# Patient Record
Sex: Male | Born: 1960 | Race: Black or African American | Hispanic: No | Marital: Single | Smoking: Current every day smoker
Health system: Southern US, Community
[De-identification: ages and names within clinical notes are randomized; demographics above are authoritative.]

## PROBLEM LIST (undated history)

## (undated) DIAGNOSIS — F419 Anxiety disorder, unspecified: Secondary | ICD-10-CM

## (undated) DIAGNOSIS — F1910 Other psychoactive substance abuse, uncomplicated: Secondary | ICD-10-CM

## (undated) DIAGNOSIS — I10 Essential (primary) hypertension: Secondary | ICD-10-CM

## (undated) DIAGNOSIS — M109 Gout, unspecified: Secondary | ICD-10-CM

## (undated) DIAGNOSIS — K219 Gastro-esophageal reflux disease without esophagitis: Secondary | ICD-10-CM

## (undated) DIAGNOSIS — E119 Type 2 diabetes mellitus without complications: Secondary | ICD-10-CM

## (undated) HISTORY — DX: Other psychoactive substance abuse, uncomplicated: F19.10

## (undated) HISTORY — DX: Gastro-esophageal reflux disease without esophagitis: K21.9

## (undated) HISTORY — DX: Gout, unspecified: M10.9

## (undated) HISTORY — DX: Type 2 diabetes mellitus without complications: E11.9

## (undated) HISTORY — DX: Anxiety disorder, unspecified: F41.9

---

## 2001-05-30 ENCOUNTER — Emergency Department (HOSPITAL_COMMUNITY): Admission: EM | Admit: 2001-05-30 | Discharge: 2001-05-30 | Payer: Self-pay | Admitting: Emergency Medicine

## 2004-03-19 ENCOUNTER — Emergency Department (HOSPITAL_COMMUNITY): Admission: EM | Admit: 2004-03-19 | Discharge: 2004-03-19 | Payer: Self-pay | Admitting: Emergency Medicine

## 2007-05-11 ENCOUNTER — Emergency Department (HOSPITAL_COMMUNITY): Admission: EM | Admit: 2007-05-11 | Discharge: 2007-05-11 | Payer: Self-pay | Admitting: Emergency Medicine

## 2007-05-19 ENCOUNTER — Emergency Department (HOSPITAL_COMMUNITY): Admission: EM | Admit: 2007-05-19 | Discharge: 2007-05-19 | Payer: Self-pay | Admitting: Emergency Medicine

## 2008-01-17 ENCOUNTER — Emergency Department (HOSPITAL_COMMUNITY): Admission: EM | Admit: 2008-01-17 | Discharge: 2008-01-17 | Payer: Self-pay | Admitting: Emergency Medicine

## 2010-10-23 NOTE — Consult Note (Signed)
NAME:  David Nicholson, HAUSEN NO.:  [id number]   MEDICAL RECORD NO.:  [id number]          PATIENT TYPE:  EMS   LOCATION:  ED                            FACILITY:  [facility]   PHYSICIAN:  J. Darreld Mclean, M.D. DATE OF BIRTH:  12/18/1960   DATE OF CONSULTATION:  03/19/2004  DATE OF DISCHARGE:  03/19/2004                                   CONSULTATION   REASON FOR CONSULTATION:  The patient got hurt on the job today.  He had a  cut to his right long finger.  He had more of a crush type injury with a  significant stellate laceration of the pulp area with some loss of the pulp  from the cut.  He was seen by Dr. Margretta Ditty in the ER, and Dr.  _______________.  Dr. Margretta Ditty has already, per my instructions, cleansed  the wound, applied scarlet red, and a bulky finger dressing.  The patient  did not lose the nail, but the nail was crushed and he may eventually need  to lose it.  There were no other injuries.  The patient is in generally good  health.   ALLERGIES:  He denies any allergies.   HOSPITAL COURSE:  He has received an antibiotic in the ER and pain  medication.   He is in a bulky dressing.   X-rays show no fracture.   IMPRESSION:  Deep laceration of the pulp area, stellate type, macerated to  the right long finger pulp area, loss of some skin and pulp.   PLAN:  Continue the scarlet red large pressure dressing.  I will see him in  the office tomorrow and remove the dressing and apply new scarlet red.  It  will take some time for this to heal in.  Prescription for Vicodin he is  given for pain, and for Keflex.  If any difficulties, asked to return to the  ER tonight.  Use a sling, keep the wound dry.  Precautions were given.      JWK/MEDQ  D:  03/19/2004  T:  03/19/2004  Job:  [id number]

## 2011-03-05 LAB — DIFFERENTIAL
Basophils Absolute: 0
Basophils Relative: 0
Eosinophils Absolute: 0.1
Eosinophils Relative: 1
Lymphocytes Relative: 28
Lymphs Abs: 2.2
Monocytes Absolute: 0.6
Monocytes Relative: 8
Neutro Abs: 5
Neutrophils Relative %: 63

## 2011-03-05 LAB — COMPREHENSIVE METABOLIC PANEL
ALT: 18
AST: 16
Albumin: 3.5
Alkaline Phosphatase: 44
BUN: 9
CO2: 27
Calcium: 9.3
Chloride: 105
Creatinine, Ser: 0.99
GFR calc Af Amer: >60
GFR calc non Af Amer: >60
Glucose, Bld: 131 — ABNORMAL HIGH
Potassium: 3.8
Sodium: 138
Total Bilirubin: 0.5
Total Protein: 6.1

## 2011-03-05 LAB — TYPE AND SCREEN
ABO/RH(D): O POS
Antibody Screen: NEGATIVE

## 2011-03-05 LAB — PROTIME-INR
INR: 1
Prothrombin Time: 13.1

## 2011-03-05 LAB — CBC
HCT: 36.6 — ABNORMAL LOW
Hemoglobin: 12.1 — ABNORMAL LOW
MCHC: 33
MCV: 86.8
Platelets: 270
RBC: 4.22
RDW: 14.8
WBC: 7.9

## 2011-03-05 LAB — LIPASE, BLOOD: Lipase: 93 — ABNORMAL HIGH

## 2011-05-24 ENCOUNTER — Emergency Department (HOSPITAL_COMMUNITY): Payer: BC Managed Care – PPO

## 2011-05-24 ENCOUNTER — Emergency Department (HOSPITAL_COMMUNITY)
Admission: EM | Admit: 2011-05-24 | Discharge: 2011-05-24 | Disposition: A | Discharge status: Home / Self Care | Payer: BC Managed Care – PPO | Attending: Emergency Medicine | Admitting: Emergency Medicine

## 2011-05-24 DIAGNOSIS — R51 Headache: Secondary | ICD-10-CM | POA: Insufficient documentation

## 2011-05-24 DIAGNOSIS — R05 Cough: Secondary | ICD-10-CM

## 2011-05-24 DIAGNOSIS — F17200 Nicotine dependence, unspecified, uncomplicated: Secondary | ICD-10-CM | POA: Insufficient documentation

## 2011-05-24 DIAGNOSIS — J069 Acute upper respiratory infection, unspecified: Secondary | ICD-10-CM | POA: Insufficient documentation

## 2011-05-24 DIAGNOSIS — IMO0001 Reserved for inherently not codable concepts without codable children: Secondary | ICD-10-CM | POA: Insufficient documentation

## 2011-05-24 DIAGNOSIS — R110 Nausea: Secondary | ICD-10-CM | POA: Insufficient documentation

## 2011-05-24 DIAGNOSIS — J3489 Other specified disorders of nose and nasal sinuses: Secondary | ICD-10-CM | POA: Insufficient documentation

## 2011-05-24 DIAGNOSIS — I10 Essential (primary) hypertension: Secondary | ICD-10-CM | POA: Insufficient documentation

## 2011-05-24 DIAGNOSIS — R509 Fever, unspecified: Secondary | ICD-10-CM | POA: Insufficient documentation

## 2011-05-24 DIAGNOSIS — R059 Cough, unspecified: Secondary | ICD-10-CM | POA: Insufficient documentation

## 2011-05-24 HISTORY — DX: Essential (primary) hypertension: I10

## 2011-05-24 LAB — BASIC METABOLIC PANEL
BUN: 11 mg/dL (ref 6–23)
CO2: 27 mEq/L (ref 19–32)
Calcium: 9.9 mg/dL (ref 8.4–10.5)
Chloride: 100 mEq/L (ref 96–112)
Creatinine, Ser: 1.11 mg/dL (ref 0.50–1.35)
GFR calc Af Amer: 87 mL/min — ABNORMAL LOW (ref >90)
GFR calc non Af Amer: 75 mL/min — ABNORMAL LOW (ref >90)
Glucose, Bld: 125 mg/dL — ABNORMAL HIGH (ref 70–99)
Potassium: 3.6 mEq/L (ref 3.5–5.1)
Sodium: 138 mEq/L (ref 135–145)

## 2011-05-24 LAB — DIFFERENTIAL
Basophils Absolute: 0 10*3/uL (ref 0.0–0.1)
Basophils Relative: 0 % (ref 0–1)
Eosinophils Absolute: 0 10*3/uL (ref 0.0–0.7)
Eosinophils Relative: 0 % (ref 0–5)
Lymphocytes Relative: 6 % — ABNORMAL LOW (ref 12–46)
Lymphs Abs: 0.7 10*3/uL (ref 0.7–4.0)
Monocytes Absolute: 0.7 10*3/uL (ref 0.1–1.0)
Monocytes Relative: 6 % (ref 3–12)
Neutro Abs: 9.5 10*3/uL — ABNORMAL HIGH (ref 1.7–7.7)
Neutrophils Relative %: 88 % — ABNORMAL HIGH (ref 43–77)

## 2011-05-24 LAB — CBC
HCT: 39.6 % (ref 39.0–52.0)
Hemoglobin: 13 g/dL (ref 13.0–17.0)
MCH: 28 pg (ref 26.0–34.0)
MCHC: 32.8 g/dL (ref 30.0–36.0)
MCV: 85.2 fL (ref 78.0–100.0)
Platelets: 234 10*3/uL (ref 150–400)
RBC: 4.65 MIL/uL (ref 4.22–5.81)
RDW: 14.7 % (ref 11.5–15.5)
WBC: 10.9 10*3/uL — ABNORMAL HIGH (ref 4.0–10.5)

## 2011-05-24 MED ORDER — IBUPROFEN 800 MG PO TABS: 800.0000 mg | ORAL_TABLET | Freq: Three times a day (TID) | ORAL | Status: AC

## 2011-05-24 MED ORDER — ONDANSETRON HCL 4 MG PO TABS: 4.0000 mg | ORAL_TABLET | Freq: Four times a day (QID) | ORAL | Status: AC

## 2011-05-24 MED ORDER — ONDANSETRON 8 MG PO TBDP
8.0000 mg | ORAL_TABLET | Freq: Once | ORAL | Status: AC
Start: 2011-05-24 — End: 2011-05-24
  Administered 2011-05-24: 8 mg via ORAL
  Filled 2011-05-24: qty 1

## 2011-05-24 MED ORDER — HYDROCOD POLST-CHLORPHEN POLST 10-8 MG/5ML PO LQCR: 5.0000 mL | Freq: Two times a day (BID) | ORAL | Status: DC | PRN

## 2011-05-24 NOTE — ED Notes (Signed)
Pt c/o cough, bodyaches, abd pain, fever, and headache since yesterday.

## 2011-05-24 NOTE — ED Provider Notes (Signed)
History     CSN: [id number] Arrival date & time: 05/24/2011  8:40 AM   First MD Initiated Contact with Patient 05/24/11 [phone]      Chief Complaint  Patient presents with  . URI    (Consider location/radiation/quality/duration/timing/severity/associated sxs/prior treatment) HPI Comments: Patient c/o body aches, fever, cough and nausea of sudden onset the day prior to ED arrival.  Describes the abd pain as aching and soreness that he contributes to excessive coughing.  Has nausea but denies vomiting and states he has been able to keep down fluids.  Reports having possible exposure to the "flu".  HE denies chest pain or shortness of breath  Patient is a 50 y.o. male presenting with flu symptoms. The history is provided by the patient.  Influenza This is a new problem. The current episode started yesterday. The problem occurs constantly. The problem has been unchanged. Associated symptoms include abdominal pain, chills, congestion, coughing, a fever, headaches, myalgias and nausea. Pertinent negatives include no arthralgias, chest pain, joint swelling, neck pain, numbness, rash, sore throat, urinary symptoms, vomiting or weakness. The symptoms are aggravated by nothing. He has tried nothing for the symptoms. The treatment provided no relief.    Past Medical History  Diagnosis Date  . Hypertension     History reviewed. No pertinent past surgical history.  History reviewed. No pertinent family history.  History  Substance Use Topics  . Smoking status: Current Everyday Smoker  . Smokeless tobacco: Not on file  . Alcohol Use: Yes     occasionally      Review of Systems  Constitutional: Positive for fever and chills. Negative for activity change and appetite change.  HENT: Positive for congestion and sneezing. Negative for sore throat, facial swelling, trouble swallowing, neck pain and neck stiffness.   Respiratory: Positive for cough.   Cardiovascular: Negative for chest pain.    Gastrointestinal: Positive for nausea and abdominal pain. Negative for vomiting, diarrhea and blood in stool.  Genitourinary: Negative for dysuria.  Musculoskeletal: Positive for myalgias. Negative for joint swelling and arthralgias.  Skin: Negative for rash.  Neurological: Positive for headaches. Negative for dizziness, weakness and numbness.  All other systems reviewed and are negative.    Allergies  Review of patient's allergies indicates no known allergies.  Home Medications  No current outpatient prescriptions on file.  BP 136/89  Pulse 99  Temp(Src) 100.3 F (37.9 C) (Oral)  Resp 20  Ht 5\' 11"  (1.803 m)  Wt 180 lb (81.647 kg)  BMI 25.10 kg/m2  SpO2 99%  Physical Exam  Nursing note and vitals reviewed. Constitutional: He is oriented to person, place, and time. He appears well-developed and well-nourished. No distress.  HENT:  Head: Normocephalic and atraumatic.  Mouth/Throat: Oropharynx is clear and moist.  Neck: Normal range of motion. Neck supple.  Cardiovascular: Normal rate, regular rhythm and normal heart sounds.   Pulmonary/Chest: Effort normal and breath sounds normal. No respiratory distress. He exhibits no tenderness.  Abdominal: Soft. He exhibits no distension. There is no tenderness. There is no rebound and no guarding.  Musculoskeletal: Normal range of motion. He exhibits no tenderness.  Lymphadenopathy:    He has no cervical adenopathy.  Neurological: He is alert and oriented to person, place, and time. No cranial nerve deficit. He exhibits normal muscle tone. Coordination normal.  Skin: Skin is warm and dry.    ED Course  Procedures (including critical care time)  Results for orders placed during the hospital encounter of 05/24/11  CBC  Component Value Range   WBC 10.9 (*) 4.0 - 10.5 (K/uL)   RBC 4.65  4.22 - 5.81 (MIL/uL)   Hemoglobin 13.0  13.0 - 17.0 (g/dL)   HCT 16.1  09.6 - 04.5 (%)   MCV 85.2  78.0 - 100.0 (fL)   MCH 28.0  26.0 -  34.0 (pg)   MCHC 32.8  30.0 - 36.0 (g/dL)   RDW 40.9  81.1 - 91.4 (%)   Platelets 234  150 - 400 (K/uL)  DIFFERENTIAL      Component Value Range   Neutrophils Relative 88 (*) 43 - 77 (%)   Neutro Abs 9.5 (*) 1.7 - 7.7 (K/uL)   Lymphocytes Relative 6 (*) 12 - 46 (%)   Lymphs Abs 0.7  0.7 - 4.0 (K/uL)   Monocytes Relative 6  3 - 12 (%)   Monocytes Absolute 0.7  0.1 - 1.0 (K/uL)   Eosinophils Relative 0  0 - 5 (%)   Eosinophils Absolute 0.0  0.0 - 0.7 (K/uL)   Basophils Relative 0  0 - 1 (%)   Basophils Absolute 0.0  0.0 - 0.1 (K/uL)  BASIC METABOLIC PANEL      Component Value Range   Sodium 138  135 - 145 (mEq/L)   Potassium 3.6  3.5 - 5.1 (mEq/L)   Chloride 100  96 - 112 (mEq/L)   CO2 27  19 - 32 (mEq/L)   Glucose, Bld 125 (*) 70 - 99 (mg/dL)   BUN 11  6 - 23 (mg/dL)   Creatinine, Ser 7.82  0.50 - 1.35 (mg/dL)   Calcium 9.9  8.4 - 95.6 (mg/dL)   GFR calc non Af Amer 75 (*) >90 (mL/min)   GFR calc Af Amer 87 (*) >90 (mL/min)    Dg Chest 2 View  05/24/2011  *RADIOLOGY REPORT*  Clinical Data: Cough and fatigue.  CHEST - 2 VIEW  Comparison: 01/17/2008.  Findings: The lungs are clear.  The heart and mediastinal structures are normal.  The osseous structures are unremarkable.  IMPRESSION: No evidence for active chest disease.  Original Report Authenticated By: Rolla Plate, M.D.       MDM    11:02 AM pt feeling better.  Has drank fluids w/o difficulty.  No vomiting during ED stay.  Non-toxic appearing.  Abd remains soft, NT w/o guarding or peritoneal signs.  Likely viral illness.  He agrees to follow-up with his PMD for recheck.    Pt feels improved after observation and/or treatment in ED.   Patient / Family / Caregiver understand and agree with initial ED impression and plan with expectations set for ED visit.         Mitsuru Dault L. [place], [place] 05/26/11 2110

## 2011-05-28 NOTE — ED Provider Notes (Signed)
Evaluation and management procedures were performed by the PA/NP under my supervision/collaboration.   Cieanna Stormes D Stormy Connon, MD 05/28/11 1530 

## 2011-06-14 ENCOUNTER — Other Ambulatory Visit: Payer: Self-pay

## 2011-06-14 ENCOUNTER — Telehealth: Payer: Self-pay

## 2011-06-14 DIAGNOSIS — Z139 Encounter for screening, unspecified: Secondary | ICD-10-CM

## 2011-06-14 NOTE — Telephone Encounter (Signed)
Gastroenterology Pre-Procedure Form     Request Date: 06/14/2011        Requesting Physician:      PATIENT INFORMATION:  David Nicholson is a 51 y.o., male (DOB=[birth year]).  PROCEDURE: Procedure(s) requested: colonoscopy Procedure Reason: screening for colon cancer  PATIENT REVIEW QUESTIONS: The patient reports the following:   1. Diabetes Melitis: no 2. Joint replacements in the past 12 months: no 3. Major health problems in the past 3 months: no 4. Has an artificial valve or MVP:no 5. Has been advised in past to take antibiotics in advance of a procedure like teeth cleaning: no}    MEDICATIONS & ALLERGIES:    Patient reports the following regarding taking any blood thinners:   Plavix? no Aspirin?no Coumadin?  no  Patient confirms/reports the following medications:  Current Outpatient Prescriptions  Medication Sig Dispense Refill  . acetaminophen (TYLENOL) 325 MG tablet Take 650 mg by mouth every 6 (six) hours as needed. Just as needed       . amLODipine (NORVASC) 10 MG tablet Take 10 mg by mouth daily.        . chlorpheniramine-HYDROcodone (TUSSIONEX PENNKINETIC ER) 10-8 MG/5ML LQCR Take 5 mLs by mouth every 12 (twelve) hours as needed.  120 mL  0    Patient confirms/reports the following allergies:  No Known Allergies  Patient is appropriate to schedule for requested procedure(s): yes  AUTHORIZATION INFORMATION Primary Insurance:   ID #:   Group #:  Pre-Cert / Auth required:  Pre-Cert / Auth #:  Secondary Insurance:   ID #:Group #:  Pre-Cert / Auth required: Pre-Cert / Auth #:   No orders of the defined types were placed in this encounter.    SCHEDULE INFORMATION: Procedure has been scheduled as follows:  Date: 06/30/2011               Time: 7:30 AM Location: [facility] Short Stay  This Gastroenterology Pre-Precedure Form is being routed to the following provider(s) for review: R. Roetta Sessions, MD

## 2011-06-15 NOTE — Telephone Encounter (Signed)
Pt said he doesn't have a PCP. Per [facility] was last seen there on 05/21/2010. Per pharmacist at [facility], Norvasc prescription prescribed by Dwyane Luo, PA.

## 2011-06-18 NOTE — Telephone Encounter (Signed)
OK as is.

## 2011-06-21 NOTE — Telephone Encounter (Signed)
Rx and instructions mailed to pt.  

## 2011-06-28 MED ORDER — SODIUM CHLORIDE 0.45 % IV SOLN
Freq: Once | INTRAVENOUS | Status: AC
  Administered 2011-06-30: 07:00:00 via INTRAVENOUS

## 2011-06-30 ENCOUNTER — Encounter (HOSPITAL_COMMUNITY): Payer: Self-pay | Admitting: *Deleted

## 2011-06-30 ENCOUNTER — Ambulatory Visit (HOSPITAL_COMMUNITY)
Admission: RE | Admit: 2011-06-30 | Discharge: 2011-06-30 | Disposition: A | Discharge status: Home / Self Care | Payer: BC Managed Care – PPO | Source: Ambulatory Visit | Attending: Internal Medicine | Admitting: Internal Medicine

## 2011-06-30 ENCOUNTER — Other Ambulatory Visit: Payer: Self-pay | Admitting: Internal Medicine

## 2011-06-30 ENCOUNTER — Encounter (HOSPITAL_COMMUNITY)
Admission: RE | Disposition: A | Discharge status: Home / Self Care | Payer: Self-pay | Source: Ambulatory Visit | Attending: Internal Medicine

## 2011-06-30 DIAGNOSIS — D126 Benign neoplasm of colon, unspecified: Secondary | ICD-10-CM | POA: Insufficient documentation

## 2011-06-30 DIAGNOSIS — K5730 Diverticulosis of large intestine without perforation or abscess without bleeding: Secondary | ICD-10-CM | POA: Insufficient documentation

## 2011-06-30 DIAGNOSIS — Z139 Encounter for screening, unspecified: Secondary | ICD-10-CM

## 2011-06-30 DIAGNOSIS — Z1211 Encounter for screening for malignant neoplasm of colon: Secondary | ICD-10-CM

## 2011-06-30 DIAGNOSIS — I10 Essential (primary) hypertension: Secondary | ICD-10-CM | POA: Insufficient documentation

## 2011-06-30 HISTORY — PX: COLONOSCOPY: SHX5424

## 2011-06-30 SURGERY — COLONOSCOPY
Anesthesia: Moderate Sedation

## 2011-06-30 MED ORDER — MIDAZOLAM HCL 5 MG/5ML IJ SOLN
INTRAMUSCULAR | Status: DC | PRN
  Administered 2011-06-30: 1 mg via INTRAVENOUS
  Administered 2011-06-30 (×2): 2 mg via INTRAVENOUS

## 2011-06-30 MED ORDER — MEPERIDINE HCL 100 MG/ML IJ SOLN
INTRAMUSCULAR | Status: AC
  Filled 2011-06-30: qty 2

## 2011-06-30 MED ORDER — MEPERIDINE HCL 100 MG/ML IJ SOLN
INTRAMUSCULAR | Status: DC | PRN
  Administered 2011-06-30: 25 mg via INTRAVENOUS
  Administered 2011-06-30: 50 mg via INTRAVENOUS

## 2011-06-30 MED ORDER — STERILE WATER FOR IRRIGATION IR SOLN
Status: DC | PRN
  Administered 2011-06-30: 08:00:00

## 2011-06-30 MED ORDER — MIDAZOLAM HCL 5 MG/5ML IJ SOLN
INTRAMUSCULAR | Status: AC
  Filled 2011-06-30: qty 10

## 2011-06-30 NOTE — Op Note (Signed)
[facility] [address] [place], [place]  [address]  COLONOSCOPY PROCEDURE REPORT  PATIENT:  David, Nicholson  MR#:  [id number] BIRTHDATE:  02/11/1961, 50 yrs. old  GENDER:  male ENDOSCOPIST:  R. Roetta Sessions, MD FACP [facility] REF. BY:          self PROCEDURE DATE:  06/30/2011 PROCEDURE:  colonoscopy with snare polypectomy  INDICATIONS:  first ever average risk screening colonoscopy  INFORMED CONSENT:  The risks, benefits, alternatives and imponderables including but not limited to bleeding, perforation as well as the possibility of a missed lesion have been reviewed. The potential for biopsy, lesion removal, etc. have also been discussed.  Questions have been answered.  All parties agreeable. Please see the history and physical in the medical record for more information.  MEDICATIONS:  Versed 5 mg IV and Demerol 75 mg in divided doses  DESCRIPTION OF PROCEDURE:  After a digital rectal exam was performed, the EC-[id number] ([id number]) colonoscope was advanced from the anus through the rectum and colon to the area of the cecum, ileocecal valve and appendiceal orifice.  The cecum was deeply intubated.  These structures were well-seen and photographed for the record.  From the level of the cecum and ileocecal valve, the scope was slowly and cautiously withdrawn.  The mucosal surfaces were carefully surveyed utilizing scope tip deflection to facilitate fold flattening as needed.  The scope was pulled down into the rectum where a thorough examination including retroflexion was performed. <<PROCEDUREIMAGES>>  FINDINGS:  good preparation. Normal rectum. Scattered left-sided diverticula; single 4 mm polyp at the hepatic flexure; otherwise, normal                        appearing colonic mucosa.  THERAPEUTIC / DIAGNOSTIC MANEUVERS PERFORMED:     The hepatic flexure polyp was cold snared/removed  COMPLICATIONS:  none  CECAL WITHDRAWAL TIME:  8 minutes  IMPRESSION:     Normal  rectum. Colonic diverticulosis. Colonic polyp-removed as described above  RECOMMENDATIONS:     Followup on pathology  ______________________________ R. Roetta Sessions, MD Caleen Essex  CC:  n. eSIGNED:   R. Roetta Sessions at 06/30/2011 08:05 AM  Victoriano Lain, [phone]

## 2011-06-30 NOTE — H&P (Signed)
  Primary Care Physician:  Colette Ribas, MD, MD Primary Gastroenterologist:  Dr. Jena Gauss  Pre-Procedure History & Physical: HPI:  David Nicholson is a 51 y.o. male is here for a screening colonoscopy. No prior colonoscopy. No bowel symptoms. No family history of polyps or colon cancer.  Past Medical History  Diagnosis Date  . Hypertension     History reviewed. No pertinent past surgical history.  Prior to Admission medications   Medication Sig Start Date End Date Taking? Authorizing Provider  acetaminophen (TYLENOL) 325 MG tablet Take 650 mg by mouth every 6 (six) hours as needed. For pain   Yes Historical Provider, MD  amLODipine (NORVASC) 10 MG tablet Take 10 mg by mouth daily.     Yes Historical Provider, MD  chlorpheniramine-HYDROcodone (TUSSIONEX) 10-8 MG/5ML LQCR Take 5 mLs by mouth every 12 (twelve) hours as needed. For cough 05/24/11  Yes Tammy L. Triplett, PA    Allergies as of 06/14/2011  . (No Known Allergies)    History reviewed. No pertinent family history.  History   Social History  . Marital Status: Married    Spouse Name: N/A    Number of Children: N/A  . Years of Education: N/A   Occupational History  . Not on file.   Social History Main Topics  . Smoking status: Current Everyday Smoker  . Smokeless tobacco: Not on file  . Alcohol Use: Yes     occasionally  . Drug Use: No  . Sexually Active:    Other Topics Concern  . Not on file   Social History Narrative  . No narrative on file    Review of Systems: See HPI, otherwise negative ROS  Physical Exam: BP 159/96  Pulse 81  Temp(Src) 98 F (36.7 C) (Oral)  Resp 20  Ht 5\' 9"  (1.753 m)  Wt 198 lb (89.812 kg)  BMI 29.24 kg/m2  SpO2 100% General:   Alert,  Well-developed, well-nourished, pleasant and cooperative in NAD Head:  Normocephalic and atraumatic. Eyes:  Sclera clear, no icterus.   Conjunctiva pink. Ears:  Normal auditory acuity. Nose:  No deformity, discharge,  or  lesions. Mouth:  No deformity or lesions, dentition normal. Neck:  Supple; no masses or thyromegaly. Lungs:  Clear throughout to auscultation.   No wheezes, crackles, or rhonchi. No acute distress. Heart:  Regular rate and rhythm; no murmurs, clicks, rubs,  or gallops. Abdomen:  Nondistended. Positive bowel sounds soft nontender without mass or organomegaly Msk:  Symmetrical without gross deformities. Normal posture. Pulses:  Normal pulses noted. Extremities:  Without clubbing or edema. Neurologic:  Alert and  oriented x4;  grossly normal neurologically. Skin:  Intact without significant lesions or rashes. Cervical Nodes:  No significant cervical adenopathy. Psych:  Alert and cooperative. Normal mood and affect.  Impression/Plan: [name] is now here to undergo a screening colonoscopy.  First ever average risk screening examination  Risks, benefits, limitations, imponderables and alternatives regarding colonoscopy have been reviewed with the patient. Questions have been answered. All parties agreeable.

## 2011-07-03 ENCOUNTER — Encounter: Payer: Self-pay | Admitting: Internal Medicine

## 2011-07-07 ENCOUNTER — Encounter (HOSPITAL_COMMUNITY): Payer: Self-pay | Admitting: Internal Medicine

## 2017-06-24 ENCOUNTER — Ambulatory Visit (HOSPITAL_COMMUNITY)
Admission: RE | Admit: 2017-06-24 | Discharge: 2017-06-24 | Disposition: A | Discharge status: Home / Self Care | Payer: BLUE CROSS/BLUE SHIELD | Source: Ambulatory Visit | Attending: Family Medicine | Admitting: Family Medicine

## 2017-06-24 ENCOUNTER — Other Ambulatory Visit (HOSPITAL_COMMUNITY): Payer: Self-pay | Admitting: Family Medicine

## 2017-06-24 DIAGNOSIS — M25512 Pain in left shoulder: Secondary | ICD-10-CM

## 2017-06-24 DIAGNOSIS — M25612 Stiffness of left shoulder, not elsewhere classified: Secondary | ICD-10-CM

## 2017-10-27 ENCOUNTER — Telehealth: Payer: Self-pay | Admitting: Orthopaedic Surgery

## 2017-10-27 NOTE — Telephone Encounter (Signed)
Patient stopped here with copy of Xray of shoulder, CD and report, from [facility]. Asking about scheduling appointment. Offered to schedule, upon receiving records from previous treatment - he states he had an injection of shoulder [place] Dr [place].  Discussed process of requesting by signing release, then once records have been released, our providers would review and approve for scheduling.  Patient states he saw Dr Luna Glasgow several years ago, and would want to schedule with him. Also had questions about primary care doctor, however, thinks he will stay with his primary care in [place].

## 2018-05-25 ENCOUNTER — Encounter: Payer: Self-pay | Admitting: Internal Medicine

## 2018-07-25 ENCOUNTER — Encounter (HOSPITAL_COMMUNITY): Payer: Self-pay | Admitting: *Deleted

## 2018-07-25 ENCOUNTER — Observation Stay (HOSPITAL_COMMUNITY)
Admission: EM | Admit: 2018-07-25 | Discharge: 2018-07-27 | Disposition: A | Discharge status: Home / Self Care | Payer: BLUE CROSS/BLUE SHIELD | Attending: Family Medicine | Admitting: Family Medicine

## 2018-07-25 ENCOUNTER — Other Ambulatory Visit: Payer: Self-pay

## 2018-07-25 DIAGNOSIS — E119 Type 2 diabetes mellitus without complications: Secondary | ICD-10-CM

## 2018-07-25 DIAGNOSIS — E1100 Type 2 diabetes mellitus with hyperosmolarity without nonketotic hyperglycemic-hyperosmolar coma (NKHHC): Principal | ICD-10-CM | POA: Insufficient documentation

## 2018-07-25 DIAGNOSIS — R531 Weakness: Secondary | ICD-10-CM | POA: Insufficient documentation

## 2018-07-25 DIAGNOSIS — I10 Essential (primary) hypertension: Secondary | ICD-10-CM | POA: Diagnosis present

## 2018-07-25 DIAGNOSIS — Z87891 Personal history of nicotine dependence: Secondary | ICD-10-CM | POA: Insufficient documentation

## 2018-07-25 DIAGNOSIS — E1129 Type 2 diabetes mellitus with other diabetic kidney complication: Secondary | ICD-10-CM | POA: Diagnosis present

## 2018-07-25 DIAGNOSIS — N289 Disorder of kidney and ureter, unspecified: Secondary | ICD-10-CM

## 2018-07-25 DIAGNOSIS — Z79899 Other long term (current) drug therapy: Secondary | ICD-10-CM | POA: Insufficient documentation

## 2018-07-25 DIAGNOSIS — R739 Hyperglycemia, unspecified: Secondary | ICD-10-CM

## 2018-07-25 DIAGNOSIS — N19 Unspecified kidney failure: Secondary | ICD-10-CM | POA: Insufficient documentation

## 2018-07-25 HISTORY — DX: Type 2 diabetes mellitus without complications: E11.9

## 2018-07-25 LAB — CBC
HEMATOCRIT: 40.2 % (ref 39.0–52.0)
Hemoglobin: 12.8 g/dL — ABNORMAL LOW (ref 13.0–17.0)
MCH: 25.3 pg — ABNORMAL LOW (ref 26.0–34.0)
MCHC: 31.8 g/dL (ref 30.0–36.0)
MCV: 79.6 fL — AB (ref 80.0–100.0)
NRBC: 0 % (ref 0.0–0.2)
PLATELETS: 273 10*3/uL (ref 150–400)
RBC: 5.05 MIL/uL (ref 4.22–5.81)
RDW: 13.8 % (ref 11.5–15.5)
WBC: 7.3 10*3/uL (ref 4.0–10.5)

## 2018-07-25 LAB — URINALYSIS, ROUTINE W REFLEX MICROSCOPIC
Bacteria, UA: NONE SEEN
Bilirubin Urine: NEGATIVE
Glucose, UA: >=500 mg/dL — AB
HGB URINE DIPSTICK: NEGATIVE
KETONES UR: NEGATIVE mg/dL
LEUKOCYTE UA: NEGATIVE
Nitrite: NEGATIVE
PROTEIN: NEGATIVE mg/dL
Specific Gravity, Urine: 1.025 (ref 1.005–1.030)
pH: 5 (ref 5.0–8.0)

## 2018-07-25 LAB — HEPATIC FUNCTION PANEL
ALBUMIN: 4.2 g/dL (ref 3.5–5.0)
ALT: 33 U/L (ref 0–44)
AST: 35 U/L (ref 15–41)
Alkaline Phosphatase: 120 U/L (ref 38–126)
Bilirubin, Direct: 0.3 mg/dL — ABNORMAL HIGH (ref 0.0–0.2)
Indirect Bilirubin: 0.6 mg/dL (ref 0.3–0.9)
Total Bilirubin: 0.9 mg/dL (ref 0.3–1.2)
Total Protein: 7.5 g/dL (ref 6.5–8.1)

## 2018-07-25 LAB — CBG MONITORING, ED
Glucose-Capillary: 403 mg/dL — ABNORMAL HIGH (ref 70–99)
Glucose-Capillary: 529 mg/dL (ref 70–99)
Glucose-Capillary: >600 mg/dL (ref 70–99)

## 2018-07-25 LAB — BASIC METABOLIC PANEL
ANION GAP: 14 (ref 5–15)
BUN: 35 mg/dL — ABNORMAL HIGH (ref 6–20)
CHLORIDE: 87 mmol/L — AB (ref 98–111)
CO2: 23 mmol/L (ref 22–32)
Calcium: 9.2 mg/dL (ref 8.9–10.3)
Creatinine, Ser: 1.91 mg/dL — ABNORMAL HIGH (ref 0.61–1.24)
GFR calc non Af Amer: 38 mL/min — ABNORMAL LOW (ref >60)
GFR, EST AFRICAN AMERICAN: 43 mL/min — AB (ref >60)
Glucose, Bld: 801 mg/dL (ref 70–99)
Potassium: 4.6 mmol/L (ref 3.5–5.1)
Sodium: 124 mmol/L — ABNORMAL LOW (ref 135–145)

## 2018-07-25 MED ORDER — INSULIN REGULAR BOLUS VIA INFUSION
0.0000 [IU] | Freq: Three times a day (TID) | INTRAVENOUS | Status: DC
  Filled 2018-07-25: qty 10

## 2018-07-25 MED ORDER — HEPARIN SODIUM (PORCINE) 5000 UNIT/ML IJ SOLN
5000.0000 [IU] | Freq: Three times a day (TID) | INTRAMUSCULAR | Status: DC
  Administered 2018-07-25 – 2018-07-27 (×5): 5000 [IU] via SUBCUTANEOUS
  Filled 2018-07-25 (×5): qty 1

## 2018-07-25 MED ORDER — SODIUM CHLORIDE 0.9 % IV SOLN: INTRAVENOUS | Status: DC

## 2018-07-25 MED ORDER — DEXTROSE 50 % IV SOLN: 25.0000 mL | INTRAVENOUS | Status: DC | PRN

## 2018-07-25 MED ORDER — SODIUM CHLORIDE 0.9 % IV BOLUS
1000.0000 mL | Freq: Once | INTRAVENOUS | Status: AC
  Administered 2018-07-25: 1000 mL via INTRAVENOUS

## 2018-07-25 MED ORDER — SODIUM CHLORIDE 0.9 % IV SOLN
INTRAVENOUS | Status: DC
  Administered 2018-07-25: 21:00:00 via INTRAVENOUS

## 2018-07-25 MED ORDER — DEXTROSE-NACL 5-0.45 % IV SOLN
INTRAVENOUS | Status: DC
  Administered 2018-07-26: 01:00:00 via INTRAVENOUS

## 2018-07-25 MED ORDER — INSULIN REGULAR(HUMAN) IN NACL 100-0.9 UT/100ML-% IV SOLN
INTRAVENOUS | Status: DC
  Administered 2018-07-25: 5.4 [IU]/h via INTRAVENOUS
  Filled 2018-07-25: qty 100

## 2018-07-25 MED ORDER — DEXTROSE-NACL 5-0.45 % IV SOLN: INTRAVENOUS | Status: DC

## 2018-07-25 MED ORDER — POTASSIUM CHLORIDE 10 MEQ/100ML IV SOLN
10.0000 meq | Freq: Once | INTRAVENOUS | Status: AC
  Administered 2018-07-25: 10 meq via INTRAVENOUS
  Filled 2018-07-25: qty 100

## 2018-07-25 MED ORDER — AMLODIPINE BESYLATE 5 MG PO TABS
10.0000 mg | ORAL_TABLET | Freq: Every day | ORAL | Status: DC
  Administered 2018-07-26 – 2018-07-27 (×2): 10 mg via ORAL
  Filled 2018-07-25 (×2): qty 2

## 2018-07-25 NOTE — ED Notes (Signed)
Admitting provider at bedside.

## 2018-07-25 NOTE — H&P (Signed)
History and Physical    David Nicholson DJT:[id number] DOB: [birth year] DOA: 07/25/2018  PCP: [name], MD   Patient coming from: Home   Chief Complaint: Polyuria, polydipsia, malaise, high blood sugar  HPI: David Nicholson is a 58 y.o. 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  Patient reports that he quit drinking alcohol approximately 4 months ago and was in a residential drug rehab program for cocaine and opiate pills until yesterday.  He noticed polyuria and polydipsia beginning about a week ago, worsening over the past 24 hours.  He has also had some general malaise for the past day but denies any fevers, chills, chest pain, shortness of breath, headache, change in vision or hearing, or focal numbness or weakness.  He went to an urgent care for evaluation of these complaints, reports having hyperglycemia there, was prescribed metformin and took 1 dose, but continued to feel poorly and came to the ED.  ED Course: Upon arrival to the ED, patient is found to be afebrile, saturating well on room air, and with blood pressure 108/68.  Chemistry panel is notable for a glucose of 801, normal bicarbonate and anion gap, BUN 35, and creatinine 1.91 with no recent labs available for comparison.  Patient was given 2 L of normal saline and started on insulin infusion in the ED.  He remains hemodynamically stable and will be observed for further evaluation and management of hyperglycemia with hyperosmolarity.  Review of Systems:  All other systems reviewed and apart from HPI, are negative.  Past Medical History:  Diagnosis Date  . Hypertension     Past Surgical History:  Procedure Laterality Date  . COLONOSCOPY  06/30/2011   Procedure: COLONOSCOPY;  Surgeon: Daneil Dolin, MD;  Location: [facility];   Service: Endoscopy;  Laterality: N/A;  7:30 AM     reports that he has been smoking. He has been smoking about 0.30 packs per day. He has never used smokeless tobacco. He reports previous alcohol use. He reports that he does not use drugs.  No Known Allergies  Family History  Problem Relation Age of Onset  . Diabetes Mother   . Diabetes Sister   . Diabetes Brother      Prior to Admission medications   Medication Sig Start Date End Date Taking? Authorizing Provider  acetaminophen (TYLENOL) 325 MG tablet Take 650 mg by mouth every 6 (six) hours as needed. For pain    [provider]  amLODipine (NORVASC) 10 MG tablet Take 10 mg by mouth daily.      [provider]  chlorpheniramine-HYDROcodone (TUSSIONEX) 10-8 MG/5ML LQCR Take 5 mLs by mouth every 12 (twelve) hours as needed. For cough 05/24/11   Kem Parkinson, PA-C    Physical Exam: Vitals:   07/25/18 2042 07/25/18 2043 07/25/18 2123 07/25/18 2130  BP: 108/68   111/69  Pulse: 92   74  Resp: 18   12  Temp: 97.7 F (36.5 C)     TempSrc: Oral     SpO2: 99%   97%  Weight:  83.9 kg    Height:  5\' 11"  (1.803 m) 5\' 11"  (1.803 m)     Constitutional: NAD, calm  Eyes: PERTLA, lids and conjunctivae normal ENMT: Mucous membranes are moist. Posterior pharynx clear of any exudate or lesions.   Neck: normal, supple, no masses, no  thyromegaly Respiratory: clear to auscultation bilaterally, no wheezing, no crackles. Normal respiratory effort.    Cardiovascular: S1 & S2 heard, regular rate and rhythm. No extremity edema.  Abdomen: No distension, no tenderness, soft. Bowel sounds normal.  Musculoskeletal: no clubbing / cyanosis. No joint deformity upper and lower extremities.    Skin: no significant rashes, lesions, ulcers. Warm, dry, well-perfused. Neurologic: CN 2-12 grossly intact. Sensation intact. Strength 5/5 in all 4 limbs.  Psychiatric: Alert and oriented x 3. Pleasant, cooperative.    Labs on Admission: I  have personally reviewed following labs and imaging studies  CBC: Recent Labs  Lab 07/25/18 2057  WBC 7.3  HGB 12.8*  HCT 40.2  MCV 79.6*  PLT 237   Basic Metabolic Panel: Recent Labs  Lab 07/25/18 2057  NA 124*  K 4.6  CL 87*  CO2 23  GLUCOSE 801*  BUN 35*  CREATININE 1.91*  CALCIUM 9.2   GFR: Estimated Creatinine Clearance: 44.4 mL/min (A) (by C-G formula based on SCr of 1.91 mg/dL (H)). Liver Function Tests: Recent Labs  Lab 07/25/18 2111  AST 35  ALT 33  ALKPHOS 120  BILITOT 0.9  PROT 7.5  ALBUMIN 4.2   No results for input(s): LIPASE, AMYLASE in the last 168 hours. No results for input(s): AMMONIA in the last 168 hours. Coagulation Profile: No results for input(s): INR, PROTIME in the last 168 hours. Cardiac Enzymes: No results for input(s): CKTOTAL, CKMB, CKMBINDEX, TROPONINI in the last 168 hours. BNP (last 3 results) No results for input(s): PROBNP in the last 8760 hours. HbA1C: No results for input(s): HGBA1C in the last 72 hours. CBG: Recent Labs  Lab 07/25/18 2044  GLUCAP >600*   Lipid Profile: No results for input(s): CHOL, HDL, LDLCALC, TRIG, CHOLHDL, LDLDIRECT in the last 72 hours. Thyroid Function Tests: No results for input(s): TSH, T4TOTAL, FREET4, T3FREE, THYROIDAB in the last 72 hours. Anemia Panel: No results for input(s): VITAMINB12, FOLATE, FERRITIN, TIBC, IRON, RETICCTPCT in the last 72 hours. Urine analysis:    Component Value Date/Time   COLORURINE STRAW (A) 07/25/2018 2057   APPEARANCEUR CLEAR 07/25/2018 2057   LABSPEC 1.025 07/25/2018 2057   PHURINE 5.0 07/25/2018 2057   GLUCOSEU >=500 (A) 07/25/2018 2057   HGBUR NEGATIVE 07/25/2018 2057   BILIRUBINUR NEGATIVE 07/25/2018 2057   KETONESUR NEGATIVE 07/25/2018 2057   PROTEINUR NEGATIVE 07/25/2018 2057   NITRITE NEGATIVE 07/25/2018 2057   LEUKOCYTESUR NEGATIVE 07/25/2018 2057   Sepsis Labs: @LABRCNTIP (procalcitonin:4,lacticidven:4) )No results found for this or any  previous visit (from the past 240 hour(s)).   Radiological Exams on Admission: No results found.  EKG: Not performed.   Assessment/Plan   1. Uncontrolled hyperglycemia with hyperosmolarity; new-onset DM  - Presents with a week of polydipsia and polyuria, general malaise, found to have serum glucose of 801 without acidosis or ketonuria  - No evidence for infection or other serial underlying illness as etiology, likely new-onset DM   - Given 2 liters NS in ED and started on insulin infusion  - Continue insulin infusion with frequent CBG's and serial chem panels, continue IVF hydration, check A1c    2. Renal insufficiency  - SCr is 1.91 on admission with no recent labs available for comparison  - Possibly acute prerenal azotemia in setting of uncontrolled hyperglycemia with osmotic diuresis  - Fluid-resuscitated in ED with 2 liters NS and continued on IVF hydration - Renally-dose medications, avoid nephrotoxins, repeat chem panel    3. Hypertension  - BP at  goal, continue Norvasc    DVT prophylaxis: sq heparin  Code Status: Full  Family Communication: Discussed with patient  Consults called: None Admission status: Observation    [name], MD [facility] Pager [phone]  If 7PM-7AM, please contact night-coverage [email or web address] Password [facility]  07/25/2018, 10:16 PM

## 2018-07-25 NOTE — ED Triage Notes (Signed)
Pt states he was feeling "bad" and went to urgent care earlier today; while there they checked his cbg and it registered over 400, the doctor there told pt he needed to come to ED; pt's wife states pt started having sx of extreme thirst and "fruity breath" yesterday

## 2018-07-25 NOTE — ED Notes (Signed)
Pt states while he was at urgent care today they prescribed him metformin; pt states he took one when he got the prescription filled

## 2018-07-25 NOTE — ED Provider Notes (Signed)
[facility] EMERGENCY DEPARTMENT Provider Note   CSN: [id number] Arrival date & time: 07/25/18  2032    History   Chief Complaint Chief Complaint  Patient presents with  . Hyperglycemia    HPI David Nicholson is a 58 y.o. male.     Patient states he has been in rehab.  He got home yesterday.  He states that he has been urinating a lot for about a week.  He went to a doctor today who found out his sugar was elevated and started him on metformin.  Patient states that he just got more weak and came to the emergency department  The history is provided by the patient. No language interpreter was used.  Weakness  Severity:  Moderate Onset quality:  Sudden Timing:  Constant Progression:  Worsening Chronicity:  New Context: not allergies   Relieved by:  Nothing Worsened by:  Nothing Ineffective treatments:  None tried Associated symptoms: no abdominal pain, no chest pain, no cough, no diarrhea, no frequency, no headaches and no seizures   Risk factors: no anemia     Past Medical History:  Diagnosis Date  . Hypertension     There are no active problems to display for this patient.   Past Surgical History:  Procedure Laterality Date  . COLONOSCOPY  06/30/2011   Procedure: COLONOSCOPY;  Surgeon: Daneil Dolin, MD;  Location: [facility];  Service: Endoscopy;  Laterality: N/A;  7:30 AM        Home Medications    Prior to Admission medications   Medication Sig Start Date End Date Taking? Authorizing Provider  acetaminophen (TYLENOL) 325 MG tablet Take 650 mg by mouth every 6 (six) hours as needed. For pain    [provider]  amLODipine (NORVASC) 10 MG tablet Take 10 mg by mouth daily.      [provider]  chlorpheniramine-HYDROcodone (TUSSIONEX) 10-8 MG/5ML LQCR Take 5 mLs by mouth every 12 (twelve) hours as needed. For cough 05/24/11   Triplett, Lynelle Smoke, PA-C    Family History History reviewed. No pertinent family history.  Social  History Social History   Tobacco Use  . Smoking status: Current Every Day Smoker    Packs/day: 0.30  . Smokeless tobacco: Never Used  Substance Use Topics  . 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 Drug use: No     Allergies   Patient has no known allergies.   Review of Systems Review of Systems  Constitutional: Negative for appetite change and fatigue.  HENT: Negative for congestion, ear discharge and sinus pressure.   Eyes: Negative for discharge.  Respiratory: Negative for cough.   Cardiovascular: Negative for chest pain.  Gastrointestinal: Negative for abdominal pain and diarrhea.  Genitourinary: Negative for frequency and hematuria.  Musculoskeletal: Negative for back pain.  Skin: Negative for rash.  Neurological: Positive for weakness. Negative for seizures and headaches.  Psychiatric/Behavioral: Negative for hallucinations.     Physical Exam Updated Vital Signs BP 111/69   Pulse 74   Temp 97.7 F (36.5 C) (Oral)   Resp 12   Ht 5\' 11"  (1.803 m)   Wt 83.9 kg   SpO2 97%   BMI 25.80 kg/m   Physical Exam Vitals signs and nursing note reviewed.  Constitutional:      Appearance: He is well-developed.  HENT:     Head: Normocephalic.     Comments: Dry mucous membrane    Nose: Nose normal.  Eyes:  General: No scleral icterus.    Conjunctiva/sclera: Conjunctivae normal.  Neck:     Musculoskeletal: Neck supple.     Thyroid: No thyromegaly.  Cardiovascular:     Rate and Rhythm: Normal rate and regular rhythm.     Heart sounds: No murmur. No friction rub. No gallop.   Pulmonary:     Breath sounds: No stridor. No wheezing or rales.  Chest:     Chest wall: No tenderness.  Abdominal:     General: There is no distension.     Tenderness: There is no abdominal tenderness. There is no rebound.  Musculoskeletal: Normal range of motion.  Lymphadenopathy:     Cervical: No cervical adenopathy.  Skin:    Findings: No erythema  or rash.  Neurological:     Mental Status: He is oriented to person, place, and time.     Motor: No abnormal muscle tone.     Coordination: Coordination normal.  Psychiatric:        Behavior: Behavior normal.      ED Treatments / Results  Labs (all labs ordered are listed, but only abnormal results are displayed) Labs Reviewed  CBC - Abnormal; Notable for the following components:      Result Value   Hemoglobin 12.8 (*)    MCV 79.6 (*)    MCH 25.3 (*)    All other components within normal limits  URINALYSIS, ROUTINE W REFLEX MICROSCOPIC - Abnormal; Notable for the following components:   Color, Urine STRAW (*)    Glucose, UA >=500 (*)    All other components within normal limits  CBG MONITORING, ED - Abnormal; Notable for the following components:   Glucose-Capillary >600 (*)    All other components within normal limits  BASIC METABOLIC PANEL  HEPATIC FUNCTION PANEL  CBG MONITORING, ED    EKG None  Radiology No results found.  Procedures Procedures (including critical care time)  Medications Ordered in ED Medications  sodium chloride 0.9 % bolus 1,000 mL (1,000 mLs Intravenous New Bag/Given 07/25/18 2112)  dextrose 5 %-0.45 % sodium chloride infusion ( Intravenous Hold 07/25/18 2114)  insulin regular bolus via infusion 0-10 Units (has no administration in time range)  insulin regular, human (MYXREDLIN) 100 units/ 100 mL infusion (5.4 Units/hr Intravenous New Bag/Given 07/25/18 2125)  dextrose 50 % solution 25 mL (has no administration in time range)  0.9 %  sodium chloride infusion ( Intravenous New Bag/Given 07/25/18 2126)     Initial Impression / Assessment and Plan / ED Course  I have reviewed the triage vital signs and the nursing notes.  Pertinent labs & imaging results that were available during my care of the patient were reviewed by me and considered in my medical decision making (see chart for details). CRITICAL CARE Performed by: Milton Ferguson Total  critical care time: 40 minutes Critical care time was exclusive of separately billable procedures and treating other patients. Critical care was necessary to treat or prevent imminent or life-threatening deterioration. Critical care was time spent personally by me on the following activities: development of treatment plan with patient and/or surrogate as well as nursing, discussions with consultants, evaluation of patient's response to treatment, examination of patient, obtaining history from patient or surrogate, ordering and performing treatments and interventions, ordering and review of laboratory studies, ordering and review of radiographic studies, pulse oximetry and re-evaluation of patient's condition.       She with new diabetes and severe hyperglycemia.  He is placed on a commander  will be admitted to medicine Final Clinical Impressions(s) / ED Diagnoses   Final diagnoses:  None    ED Discharge Orders    None       Milton Ferguson, MD 07/25/18 2148

## 2018-07-26 ENCOUNTER — Other Ambulatory Visit: Payer: Self-pay

## 2018-07-26 LAB — BASIC METABOLIC PANEL
Anion gap: 10 (ref 5–15)
Anion gap: 8 (ref 5–15)
BUN: 24 mg/dL — ABNORMAL HIGH (ref 6–20)
BUN: 30 mg/dL — ABNORMAL HIGH (ref 6–20)
CO2: 22 mmol/L (ref 22–32)
CO2: 26 mmol/L (ref 22–32)
Calcium: 8.5 mg/dL — ABNORMAL LOW (ref 8.9–10.3)
Calcium: 8.8 mg/dL — ABNORMAL LOW (ref 8.9–10.3)
Chloride: 101 mmol/L (ref 98–111)
Chloride: 104 mmol/L (ref 98–111)
Creatinine, Ser: 1.31 mg/dL — ABNORMAL HIGH (ref 0.61–1.24)
Creatinine, Ser: 1.61 mg/dL — ABNORMAL HIGH (ref 0.61–1.24)
GFR calc Af Amer: 53 mL/min — ABNORMAL LOW (ref >60)
GFR calc Af Amer: >60 mL/min (ref >60)
GFR calc non Af Amer: 46 mL/min — ABNORMAL LOW (ref >60)
GFR calc non Af Amer: 59 mL/min — ABNORMAL LOW (ref >60)
GLUCOSE: 146 mg/dL — AB (ref 70–99)
Glucose, Bld: 346 mg/dL — ABNORMAL HIGH (ref 70–99)
Potassium: 4 mmol/L (ref 3.5–5.1)
Potassium: 4.1 mmol/L (ref 3.5–5.1)
Sodium: 133 mmol/L — ABNORMAL LOW (ref 135–145)
Sodium: 138 mmol/L (ref 135–145)

## 2018-07-26 LAB — CBG MONITORING, ED
Glucose-Capillary: 211 mg/dL — ABNORMAL HIGH (ref 70–99)
Glucose-Capillary: 173 mg/dL — ABNORMAL HIGH (ref 70–99)
Glucose-Capillary: 182 mg/dL — ABNORMAL HIGH (ref 70–99)
Glucose-Capillary: 146 mg/dL — ABNORMAL HIGH (ref 70–99)
Glucose-Capillary: 126 mg/dL — ABNORMAL HIGH (ref 70–99)
Glucose-Capillary: 132 mg/dL — ABNORMAL HIGH (ref 70–99)
Glucose-Capillary: 254 mg/dL — ABNORMAL HIGH (ref 70–99)
Glucose-Capillary: 347 mg/dL — ABNORMAL HIGH (ref 70–99)

## 2018-07-26 LAB — MRSA PCR SCREENING: MRSA by PCR: NEGATIVE

## 2018-07-26 LAB — GLUCOSE, CAPILLARY
Glucose-Capillary: 290 mg/dL — ABNORMAL HIGH (ref 70–99)
Glucose-Capillary: 245 mg/dL — ABNORMAL HIGH (ref 70–99)

## 2018-07-26 MED ORDER — INSULIN ASPART 100 UNIT/ML ~~LOC~~ SOLN
3.0000 [IU] | Freq: Three times a day (TID) | SUBCUTANEOUS | Status: DC
  Administered 2018-07-26 – 2018-07-27 (×3): 3 [IU] via SUBCUTANEOUS

## 2018-07-26 MED ORDER — INSULIN ASPART 100 UNIT/ML ~~LOC~~ SOLN
0.0000 [IU] | Freq: Three times a day (TID) | SUBCUTANEOUS | Status: DC
  Administered 2018-07-26: 5 [IU] via SUBCUTANEOUS
  Administered 2018-07-26: 7 [IU] via SUBCUTANEOUS
  Filled 2018-07-26 (×2): qty 1

## 2018-07-26 MED ORDER — INSULIN GLARGINE 100 UNIT/ML ~~LOC~~ SOLN
16.0000 [IU] | SUBCUTANEOUS | Status: DC
  Administered 2018-07-26: 16 [IU] via SUBCUTANEOUS
  Filled 2018-07-26 (×2): qty 0.16

## 2018-07-26 MED ORDER — SODIUM CHLORIDE 0.9 % IV SOLN
INTRAVENOUS | Status: DC
  Administered 2018-07-26 – 2018-07-27 (×3): via INTRAVENOUS

## 2018-07-26 MED ORDER — INSULIN ASPART 100 UNIT/ML ~~LOC~~ SOLN
0.0000 [IU] | Freq: Every day | SUBCUTANEOUS | Status: DC
  Administered 2018-07-26: 2 [IU] via SUBCUTANEOUS

## 2018-07-26 MED ORDER — INSULIN ASPART 100 UNIT/ML ~~LOC~~ SOLN: 0.0000 [IU] | Freq: Every day | SUBCUTANEOUS | Status: DC

## 2018-07-26 MED ORDER — INSULIN GLARGINE 100 UNIT/ML ~~LOC~~ SOLN: 16.0000 [IU] | Freq: Two times a day (BID) | SUBCUTANEOUS | Status: DC

## 2018-07-26 MED ORDER — INSULIN NPH (HUMAN) (ISOPHANE) 100 UNIT/ML ~~LOC~~ SUSP
15.0000 [IU] | Freq: Two times a day (BID) | SUBCUTANEOUS | Status: DC
  Administered 2018-07-26 – 2018-07-27 (×2): 15 [IU] via SUBCUTANEOUS
  Filled 2018-07-26: qty 10

## 2018-07-26 MED ORDER — INSULIN ASPART 100 UNIT/ML ~~LOC~~ SOLN
0.0000 [IU] | Freq: Three times a day (TID) | SUBCUTANEOUS | Status: DC
  Administered 2018-07-26 – 2018-07-27 (×2): 5 [IU] via SUBCUTANEOUS
  Administered 2018-07-27: 3 [IU] via SUBCUTANEOUS

## 2018-07-26 MED ORDER — INSULIN GLARGINE 100 UNIT/ML ~~LOC~~ SOLN
6.0000 [IU] | Freq: Once | SUBCUTANEOUS | Status: AC
  Administered 2018-07-26: 6 [IU] via SUBCUTANEOUS
  Filled 2018-07-26: qty 0.06

## 2018-07-26 NOTE — Progress Notes (Signed)
Inpatient Diabetes Program Recommendations  AACE/ADA: New Consensus Statement on Inpatient Glycemic Control (2015)  Target Ranges:  Prepandial:   less than 140 mg/dL      Peak postprandial:   less than 180 mg/dL (1-2 hours)      Critically ill patients:  140 - 180 mg/dL   Lab Results  Component Value Date   GLUCAP 254 (H) 07/26/2018    Review of Glycemic Control  Diabetes history: New Onset Current orders for Inpatient glycemic control: Lantus 16 units + Novolog sensitive correction tid + hs 0-5 units  Inpatient Diabetes Program Recommendations:   Noted A1c is pending. -Novolog 4 units tid meal coverage if eats 50% Patient does not show current insurance.Depending on discharge plans regarding insulin versus oral agents, may need to be on Novolin insulin from [facility] for affordability. Spoke with RN [facility] and patient is still in ER. Will order Living Well With Diabetes Book after moves to upstairs room. Ordered consult to dietician & patient education videos. Will plan to speak with patient after transferred to floor.  Thank you, Nani Gasser. Alene Bergerson, RN, MSN, CDE  Diabetes Coordinator Inpatient Glycemic Control Team Team Pager [phone] (8am-5pm) 07/26/2018 10:28 AM

## 2018-07-26 NOTE — ED Notes (Signed)
Given breakfast tray. 

## 2018-07-26 NOTE — Progress Notes (Signed)
Patient Demographics:    David Nicholson, is a 58 y.o. male, DOB - 1960/10/24, UKG:[id number]  Admit date - 07/25/2018   Admitting Physician [name], MD  Outpatient Primary MD for the patient is [name], MD  LOS - 0   Chief Complaint  Patient presents with  . Hyperglycemia        Subjective:    [name] today has no fevers, no emesis,  No chest pain, polyuria persist polydipsia improving--- wife at bedside, questions answered, no chest pains no shortness of breath,  Assessment  & Plan :    Principal Problem:   Diabetes mellitus with hyperosmolarity without hyperglycemic hyperosmolar nonketotic coma ([place]) Active Problems:   Hypertension   Renal insufficiency  Brief Summary 58 y.o. 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 admitted on 07/25/18 with polyuria, polydipsia and blood sugars over 800 without acidosis or ketosis or ketonuria  Plan:-  1)New onset DM--- uncontrolled hyperglycemia , on admission glucose was 801, no DKA, responded very well to IV insulin and IV fluids transition to subcu insulin continue normal saline, diabetic education, start NPH insulin 15 units twice daily with meals, sliding scale coverage as ordered, A1c pending  2)HTN--- stable, continue amlodipine  3)AKI--- due to dehydration in the setting of severe hyperglycemia with polyuria, on admission creatinine was 1.9, repeat creatinine 1.3 after IV fluids  Disposition/Need for in-Hospital Stay- patient unable to be discharged at this time due to uncontrolled diabetes and worsening renal function in the setting of newly diagnosed DM with blood glucose on admission of 801--- requires ongoing IV fluids  Code Status : Full  Family Communication:   Wife at bedside  Disposition Plan  : home   Consults  : Diabetic educator  DVT Prophylaxis  :     Heparin -   Lab Results  Component Value Date   PLT 273 07/25/2018   Inpatient Medications  Scheduled Meds: . amLODipine  10 mg Oral Daily  . heparin  5,000 Units Subcutaneous Q8H  . insulin aspart  0-5 Units Subcutaneous QHS  . insulin aspart  0-9 Units Subcutaneous TID WC  . insulin aspart  3 Units Subcutaneous TID WC  . insulin NPH Human  15 Units Subcutaneous BID AC   Continuous Infusions: . sodium chloride 125 mL/hr at 07/26/18 1056   PRN Meds:.   Anti-infectives (From admission, onward)   None        Objective:   Vitals:   07/26/18 0624 07/26/18 0935 07/26/18 1150 07/26/18 1338  BP: 120/72 122/76 103/71 113/66  Pulse: 71 81 71 77  Resp: 16 18 15 16   Temp:      TempSrc:      SpO2: 100% 99% 99% 99%  Weight:      Height:        Wt Readings from Last 3 Encounters:  07/25/18 83.9 kg  06/30/11 89.8 kg  05/24/11 81.6 kg     Intake/Output Summary (Last 24 hours) at 07/26/2018 1543 Last data filed at 07/26/2018 1523 Gross per 24 hour  Intake 3035.55 ml  Output -  Net 3035.55 ml     Physical Exam Patient is examined daily including today on 07/26/18 , exams  remain the same as of yesterday except that has changed   Gen:- Awake Alert,  In no apparent distress  HEENT:- [place].AT, No sclera icterus Mouth--- dry oral mucosa Neck-Supple Neck,No JVD,.  Lungs-  CTAB , fair symmetrical air movement CV- S1, S2 normal, regular  Abd-  +ve B.Sounds, Abd Soft, No tenderness,    Extremity/Skin:- No  edema, pedal pulses present  Psych-affect is appropriate, oriented x3 Neuro-no new focal deficits, no tremors   Data Review:   Micro Results No results found for this or any previous visit (from the past 240 hour(s)).  Radiology Reports No results found.   CBC Recent Labs  Lab 07/25/18 2057  WBC 7.3  HGB 12.8*  HCT 40.2  PLT 273  MCV 79.6*  MCH 25.3*  MCHC 31.8  RDW 13.8    Chemistries  Recent Labs  Lab 07/25/18 2057 07/25/18 2111 07/26/18 0000  07/26/18 0601  NA 124*  --  133* 138  K 4.6  --  4.1 4.0  CL 87*  --  101 104  CO2 23  --  22 26  GLUCOSE 801*  --  346* 146*  BUN 35*  --  30* 24*  CREATININE 1.91*  --  1.61* 1.31*  CALCIUM 9.2  --  8.5* 8.8*  AST  --  35  --   --   ALT  --  33  --   --   ALKPHOS  --  120  --   --   BILITOT  --  0.9  --   --    ------------------------------------------------------------------------------------------------------------------ No results for input(s): CHOL, HDL, LDLCALC, TRIG, CHOLHDL, LDLDIRECT in the last 72 hours.  No results found for: HGBA1C ------------------------------------------------------------------------------------------------------------------ No results for input(s): TSH, T4TOTAL, T3FREE, THYROIDAB in the last 72 hours.  Invalid input(s): FREET3 ------------------------------------------------------------------------------------------------------------------ No results for input(s): VITAMINB12, FOLATE, FERRITIN, TIBC, IRON, RETICCTPCT in the last 72 hours.  Coagulation profile No results for input(s): INR, PROTIME in the last 168 hours.  No results for input(s): DDIMER in the last 72 hours.  Cardiac Enzymes No results for input(s): CKMB, TROPONINI, MYOGLOBIN in the last 168 hours.  Invalid input(s): CK ------------------------------------------------------------------------------------------------------------------ No results found for: BNP   Roxan Hockey M.D on 07/26/2018 at 3:43 PM  Go to [email or web address] - for contact info  [facility] - Office  [phone]

## 2018-07-27 LAB — BASIC METABOLIC PANEL
ANION GAP: 7 (ref 5–15)
BUN: 14 mg/dL (ref 6–20)
CO2: 22 mmol/L (ref 22–32)
Calcium: 8.5 mg/dL — ABNORMAL LOW (ref 8.9–10.3)
Chloride: 106 mmol/L (ref 98–111)
Creatinine, Ser: 1.21 mg/dL (ref 0.61–1.24)
GFR calc Af Amer: >60 mL/min (ref >60)
GFR calc non Af Amer: >60 mL/min (ref >60)
Glucose, Bld: 225 mg/dL — ABNORMAL HIGH (ref 70–99)
Potassium: 4.1 mmol/L (ref 3.5–5.1)
Sodium: 135 mmol/L (ref 135–145)

## 2018-07-27 LAB — HEMOGLOBIN A1C
Hgb A1c MFr Bld: 15.2 % — ABNORMAL HIGH (ref 4.8–5.6)
Mean Plasma Glucose: 390 mg/dL

## 2018-07-27 LAB — HIV ANTIBODY (ROUTINE TESTING W REFLEX): HIV Screen 4th Generation wRfx: NONREACTIVE

## 2018-07-27 LAB — GLUCOSE, CAPILLARY
Glucose-Capillary: 214 mg/dL — ABNORMAL HIGH (ref 70–99)
Glucose-Capillary: 298 mg/dL — ABNORMAL HIGH (ref 70–99)

## 2018-07-27 MED ORDER — INSULIN NPH ISOPHANE & REGULAR (70-30) 100 UNIT/ML ~~LOC~~ SUSP: 18.0000 [IU] | Freq: Two times a day (BID) | SUBCUTANEOUS | 2 refills | Status: DC

## 2018-07-27 MED ORDER — LISINOPRIL-HYDROCHLOROTHIAZIDE 20-12.5 MG PO TABS: 1.0000 | ORAL_TABLET | Freq: Every day | ORAL | 11 refills | Status: DC

## 2018-07-27 MED ORDER — LIVING WELL WITH DIABETES BOOK
Freq: Once | Status: AC
  Administered 2018-07-27: 12:00:00
  Filled 2018-07-27: qty 1

## 2018-07-27 MED ORDER — BLOOD GLUCOSE METER KIT: PACK | 3 refills | Status: DC

## 2018-07-27 MED ORDER — INSULIN STARTER KIT- SYRINGES (ENGLISH)
1.0000 | Freq: Once | Status: AC
  Administered 2018-07-27: 1
  Filled 2018-07-27 (×2): qty 1

## 2018-07-27 MED ORDER — METFORMIN HCL 1000 MG PO TABS: 1000.0000 mg | ORAL_TABLET | Freq: Two times a day (BID) | ORAL | 11 refills | Status: DC

## 2018-07-27 NOTE — Progress Notes (Signed)
Inpatient Diabetes Program Recommendations  AACE/ADA: New Consensus Statement on Inpatient Glycemic Control (2015)  Target Ranges:  Prepandial:   less than 140 mg/dL      Peak postprandial:   less than 180 mg/dL (1-2 hours)      Critically ill patients:  140 - 180 mg/dL   Lab Results  Component Value Date   GLUCAP 214 (H) 07/27/2018   HGBA1C 15.2 (H) 07/26/2018    Review of Glycemic Control  Inpatient Diabetes Program Recommendations:    Spoke with patient by phone (DM coordinator @ [facility]). Spoke with pt about new diagnosis and discussed A1C 15.2 results with them and explained what an A1C is, basic pathophysiology of DM Type 2, basic home care, basic diabetes diet nutrition principles, importance of checking CBGs and maintaining good CBG control to prevent long-term and short-term complications. Reviewed signs and symptoms of hyperglycemia and hypoglycemia and how to treat hypoglycemia at home. Also reviewed blood sugar goals at home.  RNs to provide ongoing basic DM education at bedside with this patient. Have ordered educational booklet, insulin syringe starter kit, and DM videos. Have also placed RD consult for DM diet education for this patient.   Patient states he feels comfortable with administering his insulin by syringe. Reviewed with patient Relion glucometer and strips are more affordable @ [employer]. Also Novolin insulin more affordable from [facility] as well.  Thank you, Nani Gasser. Khaliyah Northrop, RN, MSN, CDE  Diabetes Coordinator Inpatient Glycemic Control Team Team Pager [phone] (8am-5pm) 07/27/2018 10:55 AM

## 2018-07-27 NOTE — Progress Notes (Signed)
IV discontinued,catheter intact. Discharge instructions given on medications and follow up visits,patient verbalized understanding. Prescriptions sent with patient. Accompanied by staff to an awaiting vehicle. 

## 2018-07-27 NOTE — Care Management (Signed)
CM provided pt with MATCH and follow up with [facility].

## 2018-07-27 NOTE — Plan of Care (Signed)
Nutrition Education Note  RD consulted for nutrition education regarding new onset DM2.  Lab Results  Component Value Date   HGBA1C 15.2 (H) 07/26/2018   Met with patient and his spouse. He notes he had just gotten out of a drug/etoh rehab facility the day before he presented to the hospital. He had been there for 90 days.   Took diet recall (this is from when he was in rehab): Breakfast: Oatmeal, Boiled eggs, milk, "fake juice" Lunch: greens, carrots (may have been glazed), chicken patty Dinner: Whatever was on menu- typically ate a protein and a vegetable Beverages: Water and diet drinks. Denies drinking sugary beverages  RD thought this diet sounded too modest to invoke a A1c of >15. 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 Additionally, he says the moment he got home from rehab, he binged on cookies and chocolate milk.   Pts diet recall is from when he was admitted to rehab. Now that he is out, RD asked if he thought his eating pattern would be similar to what it was before. He was not sure, but says "ill eat however I need to".   RD noted that he should keep the same eating schedule that he did while he was in rehab. He should eat regularly and not skip meals. Explained why this is important for glycemic control, especially being he will be discharged on insulin.   RD showed copy of the Diabetic "My Plate". Recommended he aim for his meals being 25% carb and 75% veg/protein. RD reviewed the starchy vegetable vs "true" vegetables. Explained the impact of protein/fiber on glycemic response. He should never "eat a high carb food by itself" and instead pair it with a protein/fiber source.   RD showed picture of a nutrition label and highlighted the most pertinent aspects: total carb, portion size, protein, fiber. RD gave patient a carbohydrate goal of 80 grams/meal.    Beverage wise, RD asked him to continue to consume only SF/diet beverages. Noted that artificial sweeteners are OK.   Other RD recommendations included switching from white grains to whole grains, incorporating exercise into his daily routine and goal BG value pre/postprandial.    Answered all pt/spouse questions. We brainstormed several appropriate meal ideas.   Expect Good compliance. Pt is motivated to make healthy diet changes. He has excellent spousal support and she sounds to have a firm grasp on the diabetic diet. Both spouse/pt asked questions, demonstrating they were invested in improving pts BG.   Pt to d/c today. No further nutrition interventions warranted at this time. If additional nutrition issues arise, please re-consult RD.  Burtis Junes RD, LDN, CNSC Clinical Nutrition Available Tues-Sat via Pager: [phone] 07/27/2018 12:27 PM

## 2018-07-27 NOTE — Discharge Summary (Signed)
David Nicholson, is a 58 y.o. male  DOB [birth year]  MRN [id number].  Admission date:  07/25/2018  Admitting Physician  [name], MD  Discharge Date:  07/27/2018   Primary MD  [name], MD  Recommendations for primary care physician for things to follow:   1) quit smoking 2) avoid sweets 3) take medications as prescribed including insulin on blood pressure medicines 4) follow-up with physician as outpatient in 1 to 2 weeks for recheck   Admission Diagnosis  Diabetes mellitus with hyperosmolarity without hyperglycemic hyperosmolar nonketotic coma ([place]) [E11.00]   Discharge Diagnosis  Diabetes mellitus with hyperosmolarity without hyperglycemic hyperosmolar nonketotic coma ([place]) [E11.00]    Principal Problem:   Diabetes mellitus with hyperosmolarity without hyperglycemic hyperosmolar nonketotic coma ([place]) Active Problems:   Hypertension   Renal insufficiency      Past Medical History:  Diagnosis Date  . Hypertension     Past Surgical History:  Procedure Laterality Date  . COLONOSCOPY  06/30/2011   Procedure: COLONOSCOPY;  Surgeon: Daneil Dolin, MD;  Location: [facility];  Service: Endoscopy;  Laterality: N/A;  7:30 AM     HPI  from the history and physical done on the day of admission:   Patient coming from: Home   Chief Complaint: Polyuria, polydipsia, malaise, high blood sugar  HPI: David Nicholson is a 58 y.o. 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  Patient reports that he quit drinking alcohol approximately 4 months ago and was in a residential drug rehab program for cocaine and opiate pills until yesterday.  He noticed polyuria and polydipsia beginning about a week ago,  worsening over the past 24 hours.  He has also had some general malaise for the past day but denies any fevers, chills, chest pain, shortness of breath, headache, change in vision or hearing, or focal numbness or weakness.  He went to an urgent care for evaluation of these complaints, reports having hyperglycemia there, was prescribed metformin and took 1 dose, but continued to feel poorly and came to the ED.  ED Course: Upon arrival to the ED, patient is found to be afebrile, saturating well on room air, and with blood pressure 108/68.  Chemistry panel is notable for a glucose of 801, normal bicarbonate and anion gap, BUN 35, and creatinine 1.91 with no recent labs available for comparison.  Patient was given 2 L of normal saline and started on insulin infusion in the ED.  He remains hemodynamically stable and will be observed for further evaluation and management of hyperglycemia with hyperosmolarity.     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 -- 18 units twice daily with meals, metformin 1000 mg twice daily    2)HTN--- stable, treat empirically with lisinopril HCTZ 20/12.5 daily  3)AKI--- due to dehydration in the setting of severe hyperglycemia with polyuria, on admission creatinine was 1.9, repeat creatinine 1.2 after IV fluids   Diabetic educator's and case management input appreciated,   Code Status : Full  Family Communication:   Wife at bedside  Disposition Plan  : home   Consults  : Diabetic educator   Discharge  Condition: stable Follow UP  Follow-up Information    [facility] Follow up on 07/31/2018.   Why:  10:30AM Contact information: [address] [place], [place] [address] [phone]           Diet and Activity recommendation:  As advised  Discharge Instructions    Discharge Instructions    Call MD for:  difficulty breathing, headache or visual disturbances   Complete by:  As directed    Call MD for:  persistant dizziness or light-headedness   Complete by:  As directed    Call MD for:  persistant nausea and vomiting   Complete by:  As directed    Call MD for:  severe uncontrolled pain   Complete by:  As directed    Call MD for:  temperature >100.4   Complete by:  As directed    Diet - low sodium heart healthy   Complete by:  As directed    Diet Carb Modified   Complete by:  As directed    Discharge instructions   Complete by:  As directed    1) quit smoking 2) avoid sweets 3) take medications as prescribed including insulin on blood pressure medicines 4) follow-up with physician as outpatient in 1 to 2 weeks for recheck   Increase activity slowly   Complete by:  As directed         Discharge Medications     Allergies as of 07/27/2018   No Known Allergies     Medication List    STOP taking these medications   amLODipine 10 MG tablet Commonly known as:  NORVASC   aspirin-sod bicarb-citric acid 325 MG Tbef tablet Commonly known as:  ALKA-SELTZER   lisinopril 40 MG tablet Commonly known as:  PRINIVIL,ZESTRIL     TAKE these medications   blood glucose meter kit and supplies [employer] Prime or Dispense other brand based on patient and insurance preference. Use up to four times daily as directed. (FOR ICD-9 250.00, 250.01).   insulin NPH-regular Human (70-30) 100 UNIT/ML injection Commonly known as:  NOVOLIN 70/30 RELION Inject 18 Units into the skin 2 (two) times daily with a meal.   lisinopril-hydrochlorothiazide 20-12.5 MG tablet Commonly known as:   PRINZIDE,ZESTORETIC Take 1 tablet by mouth daily.   metFORMIN 1000 MG tablet Commonly known as:  GLUCOPHAGE Take 1 tablet (1,000 mg total) by mouth 2 (two) times daily with a meal. What changed:    medication strength  how much to take   omeprazole 20 MG capsule Commonly known as:  PRILOSEC Take 20 mg by mouth every morning.       Major procedures and Radiology Reports - PLEASE review detailed and final reports for all details, in brief -   Recent Results (from the past 240 hour(s))  MRSA PCR Screening  Status: None   Collection Time: 07/26/18  6:50 PM  Result Value Ref Range Status   MRSA by PCR NEGATIVE NEGATIVE Final    Comment:        The GeneXpert MRSA Assay (FDA approved for NASAL specimens only), is one component of a comprehensive MRSA colonization surveillance program. It is not intended to diagnose MRSA infection nor to guide or monitor treatment for MRSA infections. Performed at [facility], [address] [place], [place] [address]        Today   Subjective    David Nicholson today has no new           Patient has been seen and examined prior to discharge   Objective   Blood pressure 115/71, pulse 74, temperature 98.6 F (37 C), temperature source Oral, resp. rate 19, height '5\' 11"'  (1.803 m), weight 83.9 kg, SpO2 99 %.   Intake/Output Summary (Last 24 hours) at 07/27/2018 1359 Last data filed at 07/27/2018 1100 Gross per 24 hour  Intake 2737.6 ml  Output 950 ml  Net 1787.6 ml    Exam Gen:- Awake Alert, no acute distress  HEENT:- [place].AT, No sclera icterus Neck-Supple Neck,No JVD,.  Lungs-  CTAB , good air movement bilaterally  CV- S1, S2 normal, regular Abd-  +ve B.Sounds, Abd Soft, No tenderness,    Extremity/Skin:- No  edema,   good pulses Psych-affect is appropriate, oriented x3 Neuro-no new focal deficits, no tremors    Data Review   CBC w Diff:  Lab Results  Component Value Date   WBC 7.3 07/25/2018   HGB 12.8 (L)  07/25/2018   HCT 40.2 07/25/2018   PLT 273 07/25/2018   LYMPHOPCT 6 (L) 05/24/2011   MONOPCT 6 05/24/2011   EOSPCT 0 05/24/2011   BASOPCT 0 05/24/2011    CMP:  Lab Results  Component Value Date   NA 135 07/27/2018   K 4.1 07/27/2018   CL 106 07/27/2018   CO2 22 07/27/2018   BUN 14 07/27/2018   CREATININE 1.21 07/27/2018   PROT 7.5 07/25/2018   ALBUMIN 4.2 07/25/2018   BILITOT 0.9 07/25/2018   ALKPHOS 120 07/25/2018   AST 35 07/25/2018   ALT 33 07/25/2018  .   Total Discharge time is about 33 minutes  Roxan Hockey M.D on 07/27/2018 at 1:59 PM  Go to [email or web address] -  for contact info  [facility] - Office  [phone]

## 2018-07-27 NOTE — Clinical Social Work Note (Signed)
CSW response to SW consult request.  Consult is to address affordability of medications.  Nurse Case Manager to follow up.  CSW sign off.

## 2018-07-27 NOTE — Discharge Instructions (Addendum)
1) quit smoking 2) avoid sweets 3) take medications as prescribed including insulin on blood pressure medicines 4) follow-up with physician as outpatient in 1 to 2 weeks for recheck    Type 2 Diabetes Mellitus, Diagnosis, Adult Type 2 diabetes (type 2 diabetes mellitus) is a long-term (chronic) disease. In type 2 diabetes, one or both of these problems may be present:  The pancreas does not make enough of a hormone called insulin.  Cells in the body do not respond properly to insulin that the body makes (insulin resistance). Normally, insulin allows blood sugar (glucose) to enter cells in the body. The cells use glucose for energy. Insulin resistance or lack of insulin causes excess glucose to build up in the blood instead of going into cells. As a result, high blood glucose (hyperglycemia) develops. What increases the risk? The following factors may make you more likely to develop type 2 diabetes:  Having a family member with type 2 diabetes.  Being overweight or obese.  Having an inactive (sedentary) lifestyle.  Having been diagnosed with insulin resistance.  Having a history of prediabetes, gestational diabetes, or polycystic ovary syndrome (PCOS).  Being of American-Indian, African-American, Hispanic/Latino, or Asian/Pacific Islander descent. What are the signs or symptoms? In the early stage of this condition, you may not have symptoms. Symptoms develop slowly and may include:  Increased thirst (polydipsia).  Increased hunger(polyphagia).  Increased urination (polyuria).  Increased urination during the night (nocturia).  Unexplained weight loss.  Frequent infections that keep coming back (recurring).  Fatigue.  Weakness.  Vision changes, such as blurry vision.  Cuts or bruises that are slow to heal.  Tingling or numbness in the hands or feet.  Dark patches on the skin (acanthosis nigricans). How is this diagnosed? This condition is diagnosed based on your  symptoms, your medical history, a physical exam, and your blood glucose level. Your blood glucose may be checked with one or more of the following blood tests:  A fasting blood glucose (FBG) test. You will not be allowed to eat (you will fast) for 8 hours or longer before a blood sample is taken.  A random blood glucose test. This test checks blood glucose at any time of day regardless of when you ate.  An A1c (hemoglobin A1c) blood test. This test provides information about blood glucose control over the previous 2-3 months.  An oral glucose tolerance test (OGTT). This test measures your blood glucose at two times: ? After fasting. This is your baseline blood glucose level. ? Two hours after drinking a beverage that contains glucose. You may be diagnosed with type 2 diabetes if:  Your FBG level is 126 mg/dL (7.0 mmol/L) or higher.  Your random blood glucose level is 200 mg/dL (11.1 mmol/L) or higher.  Your A1c level is 6.5% or higher.  Your OGTT result is higher than 200 mg/dL (11.1 mmol/L). These blood tests may be repeated to confirm your diagnosis. How is this treated? Your treatment may be managed by a specialist called an endocrinologist. Type 2 diabetes may be treated by following instructions from your health care provider about:  Making diet and lifestyle changes. This may include: ? Following an individualized nutrition plan that is developed by a diet and nutrition specialist (registered dietitian). ? Exercising regularly. ? Finding ways to manage stress.  Checking your blood glucose level as often as told.  Taking diabetes medicines or insulin daily. This helps to keep your blood glucose levels in the healthy range. ? If you  use insulin, you may need to adjust the dosage depending on how physically active you are and what foods you eat. Your health care provider will tell you how to adjust your dosage.  Taking medicines to help prevent complications from diabetes, such  as: ? Aspirin. ? Medicine to lower cholesterol. ? Medicine to control blood pressure. Your health care provider will set individualized treatment goals for you. Your goals will be based on your age, other medical conditions you have, and how you respond to diabetes treatment. Generally, the goal of treatment is to maintain the following blood glucose levels:  Before meals (preprandial): 80-130 mg/dL (4.4-7.2 mmol/L).  After meals (postprandial): below 180 mg/dL (10 mmol/L).  A1c level: less than 7%. Follow these instructions at home: Questions to ask your health care provider  Consider asking the following questions: ? Do I need to meet with a diabetes educator? ? Where can I find a support group for people with diabetes? ? What equipment will I need to manage my diabetes at home? ? What diabetes medicines do I need, and when should I take them? ? How often do I need to check my blood glucose? ? What number can I call if I have questions? ? When is my next appointment? General instructions  Take over-the-counter and prescription medicines only as told by your health care provider.  Keep all follow-up visits as told by your health care provider. This is important.  For more information about diabetes, visit: ? American Diabetes Association (ADA): www.diabetes.org ? American Association of Diabetes Educators (AADE): www.diabeteseducator.org Contact a health care provider if:  Your blood glucose is at or above 240 mg/dL (13.3 mmol/L) for 2 days in a row.  You have been sick or have had a fever for 2 days or longer, and you are not getting better.  You have any of the following problems for more than 6 hours: ? You cannot eat or drink. ? You have nausea and vomiting. ? You have diarrhea. Get help right away if:  Your blood glucose is lower than 54 mg/dL (3.0 mmol/L).  You become confused or you have trouble thinking clearly.  You have difficulty breathing.  You have  moderate or large ketone levels in your urine. Summary  Type 2 diabetes (type 2 diabetes mellitus) is a long-term (chronic) disease. In type 2 diabetes, the pancreas does not make enough of a hormone called insulin, or cells in the body do not respond properly to insulin that the body makes (insulin resistance).  This condition is treated by making diet and lifestyle changes and taking diabetes medicines or insulin.  Your health care provider will set individualized treatment goals for you. Your goals will be based on your age, other medical conditions you have, and how you respond to diabetes treatment.  Keep all follow-up visits as told by your health care provider. This is important. This information is not intended to replace advice given to you by your health care provider. Make sure you discuss any questions you have with your health care provider. Document Released: 05/24/2005 Document Revised: 12/23/2016 Document Reviewed: 06/27/2015 Elsevier Interactive Patient Education  2019 [place].      Type 2 Diabetes Mellitus, Self Care, Adult Caring for yourself after you have been diagnosed with type 2 diabetes (type 2 diabetes mellitus) means keeping your blood sugar (glucose) under control with a balance of:  Nutrition.  Exercise.  Lifestyle changes.  Medicines or insulin, if necessary.  Support from your team  of health care providers and others. The following information explains what you need to know to manage your diabetes at home. What are the risks? Having diabetes can put you at risk for other long-term (chronic) conditions, such as heart disease and kidney disease. Your health care provider may prescribe medicines to help prevent complications from diabetes. These medicines may include:  Aspirin.  Medicine to lower cholesterol.  Medicine to control blood pressure. How to monitor blood glucose   Check your blood glucose every day, as often as told by your  health care provider.  Have your A1c (hemoglobin A1c) level checked two or more times a year, or as often as told by your health care provider. Your health care provider will set individualized treatment goals for you. Generally, the goal of treatment is to maintain the following blood glucose levels:  Before meals (preprandial): 80-130 mg/dL (4.4-7.2 mmol/L).  After meals (postprandial): below 180 mg/dL (10 mmol/L).  A1c level: less than 7%. How to manage hyperglycemia and hypoglycemia Hyperglycemia symptoms Hyperglycemia, also called high blood glucose, occurs when blood glucose is too high. Make sure you know the early signs of hyperglycemia, such as:  Increased thirst.  Hunger.  Feeling very tired.  Needing to urinate more often than usual.  Blurry vision. Hypoglycemia symptoms Hypoglycemia, also called low blood glucose, occurswith a blood glucose level at or below 70 mg/dL (3.9 mmol/L). The risk for hypoglycemia increases during or after exercise, during sleep, during illness, and when skipping meals or not eating for a long time (fasting). It is important to know the symptoms of hypoglycemia and treat it right away. Always have a 15-gram rapid-acting carbohydrate snack with you to treat low blood glucose. Family members and close friends should also know the symptoms and should understand how to treat hypoglycemia, in case you are not able to treat yourself. Symptoms may include:  Hunger.  Anxiety.  Sweating and feeling clammy.  Confusion.  Dizziness or feeling light-headed.  Sleepiness.  Nausea.  Increased heart rate.  Headache.  Blurry vision.  Irritability.  A change in coordination.  Tingling or numbness around the mouth, lips, or tongue.  Restless sleep.  Fainting.  Seizure. Treating hypoglycemia If you are alert and able to swallow safely, follow the 15:15 rule:  Take 15 grams of a rapid-acting carbohydrate. Talk with your health care  provider about how much you should take.  Rapid-acting options include: ? Glucose pills (take 15 grams). ? 6-8 pieces of hard candy. ? 4-6 oz (120-150 mL) of fruit juice. ? 4-6 oz (120-150 mL) of regular (not diet) soda. ? 1 Tbsp (15 mL) honey or sugar.  Check your blood glucose 15 minutes after you take the carbohydrate.  If the repeat blood glucose level is still at or below 70 mg/dL (3.9 mmol/L), take 15 grams of a carbohydrate again.  If your blood glucose level does not increase above 70 mg/dL (3.9 mmol/L) after 3 tries, seek emergency medical care.  After your blood glucose level returns to normal, eat a meal or a snack within 1 hour. Treating severe hypoglycemia Severe hypoglycemia is when your blood glucose level is at or below 54 mg/dL (3 mmol/L). Severe hypoglycemia is an emergency. Do not wait to see if the symptoms will go away. Get medical help right away. Call your local emergency services (911 in the [place] If you have severe hypoglycemia and you cannot eat or drink, you may need an injection of glucagon. A family member or close friend  should learn how to check your blood glucose and how to give you a glucagon injection. Ask your health care provider if you need to have an emergency glucagon injection kit available. Severe hypoglycemia may need to be treated in a hospital. The treatment may include getting glucose through an IV. You may also need treatment for the cause of your hypoglycemia. Follow these instructions at home: Take diabetes medicines as told  If your health care provider prescribed insulin or diabetes medicines, take them every day.  Do not run out of insulin or other diabetes medicines that you take. Plan ahead so you always have these available.  If you use insulin, adjust your dosage based on how physically active you are and what foods you eat. Your health care provider will tell you how to adjust your dosage. Make healthy food choices  The things  that you eat and drink affect your blood glucose and your insulin dosage. Making good choices helps to control your diabetes and prevent other health problems. A healthy meal plan includes eating lean proteins, complex carbohydrates, fresh fruits and vegetables, low-fat dairy products, and healthy fats. Make an appointment to see a diet and nutrition specialist (registered dietitian) to help you create an eating plan that is right for you. Make sure that you:  Follow instructions from your health care provider about eating or drinking restrictions.  Drink enough fluid to keep your urine pale yellow.  Keep a record of the carbohydrates that you eat. Do this by reading food labels and learning the standard serving sizes of foods.  Follow your sick day plan whenever you cannot eat or drink as usual. Make this plan in advance with your health care provider.  Stay active Exercise regularly, as told by your health care provider. This may include:  Stretching and doing strength exercises, such as yoga or weightlifting, 2 or more times a week.  Doing 150 minutes or more of moderate-intensity or vigorous-intensity exercise each week. This could be brisk walking, biking, or water aerobics. ? Spread out your activity over 3 or more days of the week. ? Do not go more than 2 days in a row without doing some kind of physical activity. When you start a new exercise or activity, work with your health care provider to adjust your insulin, medicines, or food intake as needed. Make healthy lifestyle choices  Do not use any tobacco products, such as cigarettes, chewing tobacco, and e-cigarettes. If you need help quitting, ask your health care provider.  If your health care provider says that alcohol is safe for you, limit alcohol intake to no more than 1 drink per day for nonpregnant women and 2 drinks per day for men. One drink equals 12 oz of beer (355 mL), 5 oz of wine (148 mL), or 1 oz of hard liquor (44  mL).  Learn to manage stress. If you need help with this, ask your health care provider. Care for your body   Keep your immunizations up to date. In addition to getting vaccinations as told by your health care provider, it is recommended that you get vaccinated against the following illnesses: ? The flu (influenza). Get a flu shot every year. ? Pneumonia. ? Hepatitis B.  Schedule an eye exam soon after your diagnosis, and then one time every year after that.  Check your skin and feet every day for cuts, bruises, redness, blisters, or sores. Schedule a foot exam with your health care provider once every year.  Brush your teeth and gums two times a day, and floss one or more times a day. Visit your dentist one or more times every 6 months.  Maintain a healthy weight. General instructions  Take over-the-counter and prescription medicines only as told by your health care provider.  Share your diabetes management plan with people in your workplace, school, and household.  Carry a medical alert card or wear medical alert jewelry.  Keep all follow-up visits as told by your health care provider. This is important. Questions to ask your health care provider  Do I need to meet with a diabetes educator?  Where can I find a support group for people with diabetes? Where to find more information For more information about diabetes, visit:  American Diabetes Association (ADA): www.diabetes.org  American Association of Diabetes Educators (AADE): www.diabeteseducator.org Summary  Caring for yourself after you have been diagnosed with (type 2 diabetes mellitus) means keeping your blood sugar (glucose) under control with a balance of nutrition, exercise, lifestyle changes, and medicine.  Check your blood glucose every day, as often as told by your health care provider.  Having diabetes can put you at risk for other long-term (chronic) conditions, such as heart disease and kidney disease.  Your health care provider may prescribe medicines to help prevent complications from diabetes.  Keep all follow-up visits as told by your health care provider. This is important. This information is not intended to replace advice given to you by your health care provider. Make sure you discuss any questions you have with your health care provider. Document Released: 09/15/2015 Document Revised: 11/14/2017 Document Reviewed: 06/27/2015 Elsevier Interactive Patient Education  2019 [employer].

## 2018-07-31 DIAGNOSIS — Z139 Encounter for screening, unspecified: Secondary | ICD-10-CM

## 2018-07-31 LAB — GLUCOSE, POCT (MANUAL RESULT ENTRY): POC Glucose: 196 mg/dl — AB (ref 70–99)

## 2018-07-31 NOTE — Congregational Nurse Program (Signed)
  Dept: [phone]   Congregational Nurse Program Note  Date of Encounter: 07/31/2018  Past Medical History: Past Medical History:  Diagnosis Date  . Hypertension     Encounter Details: Pt presents to clinic for screening referral for PCP post hospitalization (07/25/18-07/27/18).  States he was dx as a first time diabetic upon his admission to the hospital.  States his BP was also elevated and is now taking Diabetic and HTN meds.  States he has purchased all of his meds and are taking them as prescribed.    Denies any additional medical concerns.  Vitals checked (refer to vitals signs data for review) Random Blood glucose checked (meds were taken prior to clinic visit.  Pt referred to RN Case Manager, Dobbins for f/u r/t medical case management.  Pt enrolled in [facility] program by D.Luciana Axe and was given an appt to [facility] for Wed. Feb. 26, 2020 at 1pm

## 2018-08-02 ENCOUNTER — Ambulatory Visit: Payer: Self-pay | Admitting: Physician Assistant

## 2018-08-02 ENCOUNTER — Encounter: Payer: Self-pay | Admitting: Physician Assistant

## 2018-08-02 VITALS — BP 107/61 | HR 83 | Temp 98.2°F | Ht 70.5 in | Wt 179.5 lb

## 2018-08-02 DIAGNOSIS — I10 Essential (primary) hypertension: Secondary | ICD-10-CM

## 2018-08-02 DIAGNOSIS — E1165 Type 2 diabetes mellitus with hyperglycemia: Secondary | ICD-10-CM

## 2018-08-02 DIAGNOSIS — Z7689 Persons encountering health services in other specified circumstances: Secondary | ICD-10-CM

## 2018-08-02 DIAGNOSIS — Z125 Encounter for screening for malignant neoplasm of prostate: Secondary | ICD-10-CM

## 2018-08-02 DIAGNOSIS — F1911 Other psychoactive substance abuse, in remission: Secondary | ICD-10-CM

## 2018-08-02 MED ORDER — INSULIN GLARGINE 100 UNIT/ML SOLOSTAR PEN: 20.0000 [IU] | PEN_INJECTOR | Freq: Every day | SUBCUTANEOUS | 0 refills | Status: DC

## 2018-08-02 NOTE — Progress Notes (Signed)
BP 107/61 (BP Location: Right Arm, Patient Position: Sitting, Cuff Size: Normal)   Pulse 83   Temp 98.2 F (36.8 C)   Ht 5' 10.5" (1.791 m)   Wt 179 lb 8 oz (81.4 kg)   SpO2 93%   BMI 25.39 kg/m    Subjective:    Patient ID: David Nicholson, male    DOB: 29-Nov-1960, 58 y.o.   MRN: [id number]  HPI: David Nicholson is a 58 y.o. male presenting on 08/02/2018 for New Patient (Initial Visit) (pt is here with his wife, [name]. pt recently dx with DM at [facility]. previous patient with [facility] in [place]. pt last seen there around mid 2019)   HPI  Pt 's wife is with him today.   Pt was in hospital- discharged 2/20 with hyperglycemia, htn, renal insufficiency.  Pt just in [place] rehab [place] and just got out prior to going into the hospital.   He was in for cocaine and opiates.  He also stopped smoking cigarettes at that time.   Pt says this am fbs was 180.    Pt says he had no history or diabetes when he was going to clinic in [place]- this is a new dx for him from just last week .  a1c 15.2 while in [facility]  Relevant past medical, surgical, family and social history reviewed and updated as indicated. Interim medical history since our last visit reviewed. Allergies and medications reviewed and updated.   Current Outpatient Medications:  .  insulin NPH-regular Human (NOVOLIN 70/30 RELION) (70-30) 100 UNIT/ML injection, Inject 18 Units into the skin 2 (two) times daily with a meal., Disp: 10 mL, Rfl: 2 .  lisinopril-hydrochlorothiazide (PRINZIDE,ZESTORETIC) 20-12.5 MG tablet, Take 1 tablet by mouth daily., Disp: 30 tablet, Rfl: 11 .  metFORMIN (GLUCOPHAGE) 1000 MG tablet, Take 1 tablet (1,000 mg total) by mouth 2 (two) times daily with a meal., Disp: 60 tablet, Rfl: 11 .  omeprazole (PRILOSEC) 20 MG capsule, Take 20 mg by mouth every morning., Disp: , Rfl:     Review of Systems  Constitutional: Negative for appetite change, chills, diaphoresis,  fatigue, fever and unexpected weight change.  HENT: Positive for dental problem. Negative for congestion, drooling, ear pain, facial swelling, hearing loss, mouth sores, sneezing, sore throat, trouble swallowing and voice change.   Eyes: Positive for redness and visual disturbance. Negative for pain, discharge and itching.  Respiratory: Negative for cough, choking, shortness of breath and wheezing.   Cardiovascular: Negative for chest pain, palpitations and leg swelling.  Gastrointestinal: Positive for constipation and diarrhea. Negative for abdominal pain, blood in stool and vomiting.  Endocrine: Positive for heat intolerance. Negative for cold intolerance and polydipsia.  Genitourinary: Negative for decreased urine volume, dysuria and hematuria.  Musculoskeletal: Positive for arthralgias. Negative for back pain and gait problem.  Skin: Negative for rash.  Allergic/Immunologic: Positive for environmental allergies.  Neurological: Positive for headaches. Negative for seizures, syncope and light-headedness.  Hematological: Negative for adenopathy.  Psychiatric/Behavioral: Negative for agitation, dysphoric mood and suicidal ideas. The patient is not nervous/anxious.     Per HPI unless specifically indicated above     Objective:    BP 107/61 (BP Location: Right Arm, Patient Position: Sitting, Cuff Size: Normal)   Pulse 83   Temp 98.2 F (36.8 C)   Ht 5' 10.5" (1.791 m)   Wt 179 lb 8 oz (81.4 kg)   SpO2 93%   BMI 25.39 kg/m   Wt Readings from  Last 3 Encounters:  08/02/18 179 lb 8 oz (81.4 kg)  07/31/18 184 lb 6.4 oz (83.6 kg)  07/25/18 185 lb (83.9 kg)    Physical Exam Vitals signs reviewed.  Constitutional:      Appearance: He is well-developed.  HENT:     Head: Normocephalic and atraumatic.     Mouth/Throat:     Pharynx: No oropharyngeal exudate.  Eyes:     Conjunctiva/sclera: Conjunctivae normal.     Pupils: Pupils are equal, round, and reactive to light.  Neck:      Musculoskeletal: Neck supple.     Thyroid: No thyromegaly.  Cardiovascular:     Rate and Rhythm: Normal rate and regular rhythm.  Pulmonary:     Effort: Pulmonary effort is normal.     Breath sounds: Normal breath sounds. No wheezing or rales.  Abdominal:     General: Bowel sounds are normal.     Palpations: Abdomen is soft. There is no mass.     Tenderness: There is no abdominal tenderness.  Lymphadenopathy:     Cervical: No cervical adenopathy.  Skin:    General: Skin is warm and dry.     Findings: No rash.  Neurological:     Mental Status: He is alert and oriented to person, place, and time.  Psychiatric:        Behavior: Behavior normal.        Thought Content: Thought content normal.     Results for orders placed or performed in visit on 07/31/18  POCT glucose  Result Value Ref Range   POC Glucose 196 (A) 70 - 99 mg/dl      Assessment & Plan:   Encounter Diagnoses  Name Primary?  . Encounter to establish care Yes  . Uncontrolled type 2 diabetes mellitus with hyperglycemia ([place])   . Essential hypertension   . Screening for prostate cancer   . Substance abuse in remission ([place])      -pt to get labs -Lipids, PSA -Pt due for recall with dr Buford Dresser for colonoscopy -will discontinue insulin 70/30 and start lantus at 20u qhs.  Pt to monitor fbs and bring log to f/u appointment.  Pt to call office for fbs < 70 or > 300.   -pt encouraged to Go to DM education class at [facility] -pt is given lots of reading inforamtion on diabetes.  He is taught how to self-administer solostar -pt to follow up 2-3 weeks as he is eager to return to work.  RTO sooner prn

## 2018-08-02 NOTE — Patient Instructions (Signed)
Insulin Injection Instructions, Using Insulin Pens, Adult  A subcutaneous injection is a shot of medicine that is injected into the layer of fat and tissue between skin and muscle. People with type 1 diabetes must take insulin because their bodies do not make it. People with type 2 diabetes may need to take insulin.  There are many different types of insulin. The type of insulin that you take may determine how many injections you give yourself and when you need to give the injections.  Supplies needed:   Soap and water to wash hands.   Your insulin pen.   A new, unused needle.   Alcohol wipes.   A disposal container that is meant for sharp items (sharps container), such as an empty plastic bottle with a cover.  How to choose a site for injection  The body absorbs insulin differently, depending on where the insulin is injected (injection site). It is best to inject insulin into the same body area each time (for example, always in the abdomen), but you should use a different spot in that area for each injection. Do not inject the insulin in the same spot each time. There are five main areas that can be used for injecting. These areas include:   Abdomen. This is the preferred area.   Front of thigh.   Upper, outer side of thigh.   Upper, outer side of arm.   Upper, outer part of buttock.  How to use an insulin pen    First, follow the steps for Get ready, then continue with the steps for Inject the insulin.  Get ready  1. Wash your hands with soap and water. If soap and water are not available, use hand sanitizer.  2. Before you give yourself an insulin injection, be sure to test your blood sugar level (blood glucose level) and write down that number. Follow any instructions from your health care provider about what to do if your blood glucose level is higher or lower than your normal range.  3. Check the expiration date and the type of insulin that is in the pen.  4. If you are using CLEAR insulin, check to  see that it is clear and free of clumps.  5. If you are using CLOUDY insulin, do not shake the pen to get the injection ready. Instead, get it ready in one of these ways:  ? Gently roll the pen between your palms several times.  ? Tip the pen up and down several times.  6. Remove the cap from the insulin pen.  7. Use an alcohol wipe to clean the rubber tip of the pen.  8. Remove the protective paper tab from the disposable needle. Do not let the needle touch anything.  9. Screw a new, unused needle onto the pen.  10. Remove the outer plastic needle cover. Do not throw away the outer plastic cover yet.  ? If the pen uses a special safety needle, leave the inner needle shield in place.  ? If the pen does not use a special safety needle, remove the inner plastic cover from the needle.  11. Follow the manufacturer's instructions to prime the insulin pen with the volume of insulin needed. Hold the pen with the needle pointing up, and push the button on the opposite end of the pen until a drop of insulin appears at the needle tip. If no insulin appears, repeat this step.  12. Turn the button (dial) to the number of units of   insulin that you will be injecting.  Inject the insulin  1. Use an alcohol wipe to clean the site where you will be injecting the needle. Let the site air-dry.  2. Hold the pen in the palm of your writing hand like a pencil.  3. If directed by your health care provider, use your other hand to pinch and hold about an inch (2.5 cm) of skin at the injection site. Do not directly touch the cleaned part of the skin.  4. Gently but quickly, use your writing hand to put the needle straight into the skin. The needle should be at a 90-degree angle (perpendicular) to the skin.  5. When the needle is completely inserted into the skin, use your thumb or index finger of your writing hand to push the top button of the pen down all the way to inject the insulin.  6. Let go of the skin that you are pinching. Continue  to hold the pen in place with your writing hand.  7. Wait 10 seconds, then pull the needle straight out of the skin. This will allow all of the insulin to go from the pen and needle into your body.  8. Carefully put the larger (outer) plastic cover of the needle back over the needle, then unscrew the capped needle and discard it in a sharps container, such as an empty plastic bottle with a cover.  9. Put the plastic cap back on the insulin pen.  How to throw away supplies   Discard all used needles in a puncture-proof sharps disposal container. You can ask your local pharmacy about where you can get this kind of disposal container, or you can use an empty plastic liquid laundry detergent bottle that has a cover.   Follow the disposal regulations for the area where you live. Do not use any needle more than one time.   Throw away empty disposable pens in the regular trash.  Questions to ask your health care provider   How often should I be taking insulin?   How often should I check my blood glucose?   What amount of insulin should I be taking at each time?   What are the side effects?   What should I do if my blood glucose is too high?   What should I do if my blood glucose is too low?   What should I do if I forget to take my insulin?   What number should I call if I have questions?  Where to find more information   American Diabetes Association (ADA): www.diabetes.org   American Association of Diabetes Educators (AADE) Patient Resources: https://www.diabeteseducator.org  Summary   A subcutaneous injection is a shot of medicine that is injected into the layer of fat and tissue between skin and muscle.   Before you give yourself an insulin injection, be sure to test your blood sugar level (blood glucose level) and write down that number.   Check the expiration date and the type of insulin that is in the pen. The type of insulin that you take may determine how many injections you give yourself and when  you need to give the injections.   It is best to inject insulin into the same body area each time (for example, always in the abdomen), but you should use a different spot in that area for each injection.  This information is not intended to replace advice given to you by your health care provider. Make sure you discuss   any questions you have with your health care provider.  Document Released: 06/27/2015 Document Revised: 06/13/2017 Document Reviewed: 06/27/2015  Elsevier Interactive Patient Education  2019 [employer] Inc[employer]

## 2018-08-03 ENCOUNTER — Other Ambulatory Visit (HOSPITAL_COMMUNITY)
Admission: RE | Admit: 2018-08-03 | Discharge: 2018-08-03 | Disposition: A | Discharge status: Home / Self Care | Payer: Self-pay | Source: Ambulatory Visit | Attending: Physician Assistant | Admitting: Physician Assistant

## 2018-08-03 DIAGNOSIS — E1165 Type 2 diabetes mellitus with hyperglycemia: Secondary | ICD-10-CM

## 2018-08-03 DIAGNOSIS — I10 Essential (primary) hypertension: Secondary | ICD-10-CM

## 2018-08-03 DIAGNOSIS — Z125 Encounter for screening for malignant neoplasm of prostate: Secondary | ICD-10-CM

## 2018-08-03 LAB — LIPID PANEL
CHOL/HDL RATIO: 5.1 ratio
Cholesterol: 189 mg/dL (ref 0–200)
HDL: 37 mg/dL — ABNORMAL LOW (ref >40)
LDL Cholesterol: 111 mg/dL — ABNORMAL HIGH (ref 0–99)
Triglycerides: 203 mg/dL — ABNORMAL HIGH (ref <150)
VLDL: 41 mg/dL — ABNORMAL HIGH (ref 0–40)

## 2018-08-03 LAB — COMPREHENSIVE METABOLIC PANEL
ALT: 21 U/L (ref 0–44)
AST: 16 U/L (ref 15–41)
Albumin: 3.7 g/dL (ref 3.5–5.0)
Alkaline Phosphatase: 68 U/L (ref 38–126)
Anion gap: 8 (ref 5–15)
BUN: 18 mg/dL (ref 6–20)
CO2: 24 mmol/L (ref 22–32)
Calcium: 9.5 mg/dL (ref 8.9–10.3)
Chloride: 101 mmol/L (ref 98–111)
Creatinine, Ser: 1.4 mg/dL — ABNORMAL HIGH (ref 0.61–1.24)
GFR calc Af Amer: >60 mL/min (ref >60)
GFR calc non Af Amer: 55 mL/min — ABNORMAL LOW (ref >60)
Glucose, Bld: 174 mg/dL — ABNORMAL HIGH (ref 70–99)
POTASSIUM: 4.7 mmol/L (ref 3.5–5.1)
Sodium: 133 mmol/L — ABNORMAL LOW (ref 135–145)
Total Bilirubin: 0.3 mg/dL (ref 0.3–1.2)
Total Protein: 7.2 g/dL (ref 6.5–8.1)

## 2018-08-03 LAB — PSA: Prostatic Specific Antigen: 1.58 ng/mL (ref 0.00–4.00)

## 2018-08-04 LAB — MICROALBUMIN, URINE: Microalb, Ur: 7.3 ug/mL — ABNORMAL HIGH

## 2018-08-17 ENCOUNTER — Ambulatory Visit: Payer: Self-pay | Admitting: Physician Assistant

## 2018-08-17 ENCOUNTER — Other Ambulatory Visit: Payer: Self-pay

## 2018-08-17 VITALS — BP 131/80 | HR 76 | Temp 97.0°F

## 2018-08-17 DIAGNOSIS — I10 Essential (primary) hypertension: Secondary | ICD-10-CM

## 2018-08-17 DIAGNOSIS — E785 Hyperlipidemia, unspecified: Secondary | ICD-10-CM

## 2018-08-17 DIAGNOSIS — N289 Disorder of kidney and ureter, unspecified: Secondary | ICD-10-CM

## 2018-08-17 DIAGNOSIS — E1165 Type 2 diabetes mellitus with hyperglycemia: Secondary | ICD-10-CM

## 2018-08-17 MED ORDER — SIMVASTATIN 20 MG PO TABS: 20.0000 mg | ORAL_TABLET | Freq: Every day | ORAL | 4 refills | Status: DC

## 2018-08-17 NOTE — Progress Notes (Signed)
BP 131/80   Pulse 76   Temp (!) 97 F (36.1 C)   SpO2 99%    Subjective:    Patient ID: David Nicholson, male    DOB: 1961/02/02, 58 y.o.   MRN: [id number]  HPI: David Nicholson is a 58 y.o. male presenting on 08/17/2018 for No chief complaint on file.   HPI   bs log in a little booklet- all in the 100s.  This am 131  Pt says he is feeling well and he is ready to RTW.   Relevant past medical, surgical, family and social history reviewed and updated as indicated. Interim medical history since our last visit reviewed. Allergies and medications reviewed and updated.   Current Outpatient Medications:  .  Insulin Glargine (LANTUS SOLOSTAR) 100 UNIT/ML Solostar Pen, Inject 20 Units into the skin daily., Disp: 1 pen, Rfl: 0 .  lisinopril-hydrochlorothiazide (PRINZIDE,ZESTORETIC) 20-12.5 MG tablet, Take 1 tablet by mouth daily., Disp: 30 tablet, Rfl: 11 .  metFORMIN (GLUCOPHAGE) 1000 MG tablet, Take 1 tablet (1,000 mg total) by mouth 2 (two) times daily with a meal., Disp: 60 tablet, Rfl: 11 .  omeprazole (PRILOSEC) 20 MG capsule, Take 20 mg by mouth every morning., Disp: , Rfl:    Review of Systems  Constitutional: Negative for appetite change, chills, diaphoresis, fatigue, fever and unexpected weight change.  HENT: Negative for congestion, dental problem, drooling, ear pain, facial swelling, hearing loss, mouth sores, sneezing, sore throat, trouble swallowing and voice change.   Eyes: Negative for pain, discharge, redness, itching and visual disturbance.  Respiratory: Negative for cough, choking, shortness of breath and wheezing.   Cardiovascular: Negative for chest pain, palpitations and leg swelling.  Gastrointestinal: Negative for abdominal pain, blood in stool, constipation, diarrhea and vomiting.  Endocrine: Negative for cold intolerance, heat intolerance and polydipsia.  Genitourinary: Negative for decreased urine volume, dysuria and hematuria.  Musculoskeletal: Negative for  arthralgias, back pain and gait problem.  Skin: Negative for rash.  Allergic/Immunologic: Negative for environmental allergies.  Neurological: Negative for seizures, syncope, light-headedness and headaches.  Hematological: Negative for adenopathy.  Psychiatric/Behavioral: Negative for agitation, dysphoric mood and suicidal ideas. The patient is not nervous/anxious.     Per HPI unless specifically indicated above     Objective:    BP 131/80   Pulse 76   Temp (!) 97 F (36.1 C)   SpO2 99%   Wt Readings from Last 3 Encounters:  08/02/18 179 lb 8 oz (81.4 kg)  07/31/18 184 lb 6.4 oz (83.6 kg)  07/25/18 185 lb (83.9 kg)    Physical Exam Vitals signs reviewed.  Constitutional:      Appearance: He is well-developed.  HENT:     Head: Normocephalic and atraumatic.  Neck:     Musculoskeletal: Neck supple.  Cardiovascular:     Rate and Rhythm: Normal rate and regular rhythm.  Pulmonary:     Effort: Pulmonary effort is normal.     Breath sounds: Normal breath sounds. No wheezing.  Abdominal:     General: Bowel sounds are normal.     Palpations: Abdomen is soft.     Tenderness: There is no abdominal tenderness.  Lymphadenopathy:     Cervical: No cervical adenopathy.  Skin:    General: Skin is warm and dry.  Neurological:     Mental Status: He is alert and oriented to person, place, and time.  Psychiatric:        Behavior: Behavior normal.     Results for orders  placed or performed during the hospital encounter of 08/03/18  Microalbumin, urine  Result Value Ref Range   Microalb, Ur 7.3 (H) Not Estab. ug/mL  Comprehensive metabolic panel  Result Value Ref Range   Sodium 133 (L) 135 - 145 mmol/L   Potassium 4.7 3.5 - 5.1 mmol/L   Chloride 101 98 - 111 mmol/L   CO2 24 22 - 32 mmol/L   Glucose, Bld 174 (H) 70 - 99 mg/dL   BUN 18 6 - 20 mg/dL   Creatinine, Ser 1.40 (H) 0.61 - 1.24 mg/dL   Calcium 9.5 8.9 - 10.3 mg/dL   Total Protein 7.2 6.5 - 8.1 g/dL   Albumin 3.7 3.5  - 5.0 g/dL   AST 16 15 - 41 U/L   ALT 21 0 - 44 U/L   Alkaline Phosphatase 68 38 - 126 U/L   Total Bilirubin 0.3 0.3 - 1.2 mg/dL   GFR calc non Af Amer 55 (L) >60 mL/min   GFR calc Af Amer >60 >60 mL/min   Anion gap 8 5 - 15  Lipid panel  Result Value Ref Range   Cholesterol 189 0 - 200 mg/dL   Triglycerides 203 (H) <150 mg/dL   HDL 37 (L) >40 mg/dL   Total CHOL/HDL Ratio 5.1 RATIO   VLDL 41 (H) 0 - 40 mg/dL   LDL Cholesterol 111 (H) 0 - 99 mg/dL  PSA  Result Value Ref Range   Prostatic Specific Antigen 1.58 0.00 - 4.00 ng/mL      Assessment & Plan:   Encounter Diagnoses  Name Primary?  Marland Kitchen Uncontrolled type 2 diabetes mellitus with hyperglycemia ([place]) Yes  . Essential hypertension   . Hyperlipidemia, unspecified hyperlipidemia type   . Renal insufficiency     -reviewed labs iwht pt -will Add simvastatin (Pt was given handwritten Rx) and gave lowfat diet sheet -pt is instructed that he can RTW -pt to follow up 1 month to recheck .  RTO sooner prn

## 2018-09-18 ENCOUNTER — Encounter: Payer: Self-pay | Admitting: Physician Assistant

## 2018-09-18 ENCOUNTER — Telehealth: Payer: Self-pay | Admitting: Physician Assistant

## 2018-09-18 ENCOUNTER — Ambulatory Visit: Payer: Self-pay | Admitting: Physician Assistant

## 2018-09-18 DIAGNOSIS — E785 Hyperlipidemia, unspecified: Secondary | ICD-10-CM

## 2018-09-18 DIAGNOSIS — N289 Disorder of kidney and ureter, unspecified: Secondary | ICD-10-CM

## 2018-09-18 DIAGNOSIS — E118 Type 2 diabetes mellitus with unspecified complications: Secondary | ICD-10-CM

## 2018-09-18 DIAGNOSIS — K0889 Other specified disorders of teeth and supporting structures: Secondary | ICD-10-CM

## 2018-09-18 DIAGNOSIS — I10 Essential (primary) hypertension: Secondary | ICD-10-CM

## 2018-09-18 MED ORDER — SIMVASTATIN 20 MG PO TABS: 20.0000 mg | ORAL_TABLET | Freq: Every day | ORAL | 4 refills | Status: DC

## 2018-09-18 MED ORDER — AMOXICILLIN 500 MG PO CAPS: 500.0000 mg | ORAL_CAPSULE | Freq: Three times a day (TID) | ORAL | 0 refills | Status: DC

## 2018-09-18 MED ORDER — LISINOPRIL-HYDROCHLOROTHIAZIDE 20-12.5 MG PO TABS: 1.0000 | ORAL_TABLET | Freq: Every day | ORAL | 5 refills | Status: DC

## 2018-09-18 NOTE — Progress Notes (Signed)
There were no vitals taken for this visit.   Subjective:    Patient ID: David Nicholson, male    DOB: August 01, 1960, 58 y.o.   MRN: [id number]  HPI: David Nicholson is a 58 y.o. male presenting on 09/18/2018 for No chief complaint on file.   HPI   This is a telemedicine visit through [employer] due to coronavirus pandemic.  I connected with  David Nicholson on 09/18/18 by a video enabled telemedicine application and verified that I am speaking with the correct person using two identifiers.   I discussed the limitations of evaluation and management by telemedicine. The patient expressed understanding and agreed to proceed.    Pt is sneezing due to allergies but otherwise he feels really good.    Pt is working part time.  He works at [employer] care.    Pt is monitoring his bs.  He says the Highest was 128- others 112, 111, 108.    He says he has a tooth that is hurting- he says it has a hole in it due to a cavity.  He denies swelling of the face.   Relevant past medical, surgical, family and social history reviewed and updated as indicated. Interim medical history since our last visit reviewed. Allergies and medications reviewed and updated.   Current Outpatient Medications:  .  Insulin Glargine (LANTUS SOLOSTAR) 100 UNIT/ML Solostar Pen, Inject 20 Units into the skin daily., Disp: 1 pen, Rfl: 0 .  lisinopril-hydrochlorothiazide (PRINZIDE,ZESTORETIC) 20-12.5 MG tablet, Take 1 tablet by mouth daily., Disp: 30 tablet, Rfl: 11 .  metFORMIN (GLUCOPHAGE) 1000 MG tablet, Take 1 tablet (1,000 mg total) by mouth 2 (two) times daily with a meal., Disp: 60 tablet, Rfl: 11 .  omeprazole (PRILOSEC) 20 MG capsule, Take 20 mg by mouth every morning., Disp: , Rfl:  .  simvastatin (ZOCOR) 20 MG tablet, Take 1 tablet (20 mg total) by mouth at bedtime., Disp: 30 tablet, Rfl: 4    Review of Systems  Per HPI unless specifically indicated above     Objective:    There were no vitals taken for  this visit.  Wt Readings from Last 3 Encounters:  08/02/18 179 lb 8 oz (81.4 kg)  07/31/18 184 lb 6.4 oz (83.6 kg)  07/25/18 185 lb (83.9 kg)    Physical Exam Constitutional:      General: He is not in acute distress.    Appearance: He is not ill-appearing.  HENT:     Head: Normocephalic and atraumatic.     Comments: No swelling of the face seen Pulmonary:     Effort: Pulmonary effort is normal. No respiratory distress.  Neurological:     Mental Status: He is alert and oriented to person, place, and time.  Psychiatric:        Mood and Affect: Mood normal.        Behavior: Behavior normal.         Assessment & Plan:   Encounter Diagnoses  Name Primary?  . Type 2 diabetes mellitus with unspecified complications ([place]) Yes  . Dentalgia   . Essential hypertension   . Hyperlipidemia, unspecified hyperlipidemia type   . Renal insufficiency     -Pt is put on Dental list for painful tooth-  It has a hole in it due to cavity.   rx amoxil is given  -pt to Continue current meds -pt to Continue to monitor bs. He is reminded to Call office for fbs < 70 or > 300.  -  pt to follow up 2 months.  Pt to contact office sooner prn

## 2018-10-17 ENCOUNTER — Other Ambulatory Visit: Payer: Self-pay | Admitting: Physician Assistant

## 2018-10-17 MED ORDER — METFORMIN HCL 1000 MG PO TABS: 1000.0000 mg | ORAL_TABLET | Freq: Two times a day (BID) | ORAL | 1 refills | Status: DC

## 2018-11-15 ENCOUNTER — Other Ambulatory Visit: Payer: Self-pay | Admitting: Physician Assistant

## 2018-11-15 DIAGNOSIS — E785 Hyperlipidemia, unspecified: Secondary | ICD-10-CM

## 2018-11-15 DIAGNOSIS — E118 Type 2 diabetes mellitus with unspecified complications: Secondary | ICD-10-CM

## 2018-11-15 DIAGNOSIS — I10 Essential (primary) hypertension: Secondary | ICD-10-CM

## 2018-11-24 ENCOUNTER — Other Ambulatory Visit: Payer: Self-pay

## 2018-11-24 ENCOUNTER — Other Ambulatory Visit (HOSPITAL_COMMUNITY)
Admission: RE | Admit: 2018-11-24 | Discharge: 2018-11-24 | Disposition: A | Discharge status: Home / Self Care | Payer: Self-pay | Source: Ambulatory Visit | Attending: Physician Assistant | Admitting: Physician Assistant

## 2018-11-24 DIAGNOSIS — I10 Essential (primary) hypertension: Secondary | ICD-10-CM | POA: Insufficient documentation

## 2018-11-24 DIAGNOSIS — E118 Type 2 diabetes mellitus with unspecified complications: Secondary | ICD-10-CM | POA: Insufficient documentation

## 2018-11-24 DIAGNOSIS — E785 Hyperlipidemia, unspecified: Secondary | ICD-10-CM | POA: Insufficient documentation

## 2018-11-24 LAB — COMPREHENSIVE METABOLIC PANEL
ALT: 28 U/L (ref 0–44)
AST: 18 U/L (ref 15–41)
Albumin: 4 g/dL (ref 3.5–5.0)
Alkaline Phosphatase: 44 U/L (ref 38–126)
Anion gap: 9 (ref 5–15)
BUN: 20 mg/dL (ref 6–20)
CO2: 26 mmol/L (ref 22–32)
Calcium: 9.5 mg/dL (ref 8.9–10.3)
Chloride: 107 mmol/L (ref 98–111)
Creatinine, Ser: 1.64 mg/dL — ABNORMAL HIGH (ref 0.61–1.24)
GFR calc Af Amer: 52 mL/min — ABNORMAL LOW (ref >60)
GFR calc non Af Amer: 45 mL/min — ABNORMAL LOW (ref >60)
Glucose, Bld: 123 mg/dL — ABNORMAL HIGH (ref 70–99)
Potassium: 4.5 mmol/L (ref 3.5–5.1)
Sodium: 142 mmol/L (ref 135–145)
Total Bilirubin: 0.5 mg/dL (ref 0.3–1.2)
Total Protein: 6.5 g/dL (ref 6.5–8.1)

## 2018-11-24 LAB — HEMOGLOBIN A1C
Hgb A1c MFr Bld: 6.7 % — ABNORMAL HIGH (ref 4.8–5.6)
Mean Plasma Glucose: 145.59 mg/dL

## 2018-11-24 LAB — LIPID PANEL
Cholesterol: 171 mg/dL (ref 0–200)
HDL: 54 mg/dL (ref >40)
LDL Cholesterol: 60 mg/dL (ref 0–99)
Total CHOL/HDL Ratio: 3.2 RATIO
Triglycerides: 285 mg/dL — ABNORMAL HIGH (ref <150)
VLDL: 57 mg/dL — ABNORMAL HIGH (ref 0–40)

## 2018-11-27 ENCOUNTER — Encounter: Payer: Self-pay | Admitting: Physician Assistant

## 2018-11-27 ENCOUNTER — Ambulatory Visit: Payer: Self-pay | Admitting: Physician Assistant

## 2018-11-27 DIAGNOSIS — E785 Hyperlipidemia, unspecified: Secondary | ICD-10-CM

## 2018-11-27 DIAGNOSIS — N289 Disorder of kidney and ureter, unspecified: Secondary | ICD-10-CM

## 2018-11-27 DIAGNOSIS — I10 Essential (primary) hypertension: Secondary | ICD-10-CM

## 2018-11-27 DIAGNOSIS — N529 Male erectile dysfunction, unspecified: Secondary | ICD-10-CM

## 2018-11-27 DIAGNOSIS — E118 Type 2 diabetes mellitus with unspecified complications: Secondary | ICD-10-CM

## 2018-11-27 MED ORDER — SILDENAFIL CITRATE 100 MG PO TABS: 50.0000 mg | ORAL_TABLET | Freq: Every day | ORAL | 3 refills | Status: DC | PRN

## 2018-11-27 NOTE — Patient Instructions (Signed)
Sildenafil tablets (Erectile Dysfunction)  What is this medicine?  SILDENAFIL (sil DEN a fil) is used to treat erection problems in men.  This medicine may be used for other purposes; ask your health care provider or pharmacist if you have questions.  COMMON BRAND NAME(S): Viagra  What should I tell my health care provider before I take this medicine?  They need to know if you have any of these conditions:  -bleeding disorders  -eye or vision problems, including a rare inherited eye disease called retinitis pigmentosa  -anatomical deformation of the penis, Peyronie's disease, or history of priapism (painful and prolonged erection)  -heart disease, angina, a history of heart attack, irregular heart beats, or other heart problems  -high or low blood pressure  -history of blood diseases, like sickle cell anemia or leukemia  -history of stomach bleeding  -kidney disease  -liver disease  -stroke  -an unusual or allergic reaction to sildenafil, other medicines, foods, dyes, or preservatives  -pregnant or trying to get pregnant  -breast-feeding  How should I use this medicine?  Take this medicine by mouth with a glass of water. Follow the directions on the prescription label. The dose is usually taken 1 hour before sexual activity. You should not take the dose more than once per day. Do not take your medicine more often than directed.  Talk to your pediatrician regarding the use of this medicine in children. This medicine is not used in children for this condition.  Overdosage: If you think you have taken too much of this medicine contact a poison control center or emergency room at once.  NOTE: This medicine is only for you. Do not share this medicine with others.  What if I miss a dose?  This does not apply. Do not take double or extra doses.  What may interact with this medicine?  Do not take this medicine with any of the following medications:  -cisapride  -nitrates like amyl nitrite, isosorbide dinitrate, isosorbide  mononitrate, nitroglycerin  -riociguat  This medicine may also interact with the following medications:  -antiviral medicines for HIV or AIDS  -bosentan  -certain medicines for benign prostatic hyperplasia (BPH)  -certain medicines for blood pressure  -certain medicines for fungal infections like ketoconazole and itraconazole  -cimetidine  -erythromycin  -rifampin  This list may not describe all possible interactions. Give your health care provider a list of all the medicines, herbs, non-prescription drugs, or dietary supplements you use. Also tell them if you smoke, drink alcohol, or use illegal drugs. Some items may interact with your medicine.  What should I watch for while using this medicine?  If you notice any changes in your vision while taking this drug, call your doctor or health care professional as soon as possible. Stop using this medicine and call your health care provider right away if you have a loss of sight in one or both eyes.  Contact your doctor or health care professional right away if you have an erection that lasts longer than 4 hours or if it becomes painful. This may be a sign of a serious problem and must be treated right away to prevent permanent damage.  If you experience symptoms of nausea, dizziness, chest pain or arm pain upon initiation of sexual activity after taking this medicine, you should refrain from further activity and call your doctor or health care professional as soon as possible.  Do not drink alcohol to excess (examples, 5 glasses of wine or 5 shots of   whiskey) when taking this medicine. When taken in excess, alcohol can increase your chances of getting a headache or getting dizzy, increasing your heart rate or lowering your blood pressure.  Using this medicine does not protect you or your partner against HIV infection (the virus that causes AIDS) or other sexually transmitted diseases.  What side effects may I notice from receiving this medicine?  Side effects that you  should report to your doctor or health care professional as soon as possible:  -allergic reactions like skin rash, itching or hives, swelling of the face, lips, or tongue  -breathing problems  -changes in hearing  -changes in vision  -chest pain  -fast, irregular heartbeat  -prolonged or painful erection  -seizures  Side effects that usually do not require medical attention (report to your doctor or health care professional if they continue or are bothersome):  -back pain  -dizziness  -flushing  -headache  -indigestion  -muscle aches  -nausea  -stuffy or runny nose  This list may not describe all possible side effects. Call your doctor for medical advice about side effects. You may report side effects to FDA at 1-800-FDA-1088.  Where should I keep my medicine?  Keep out of reach of children.  Store at room temperature between 15 and 30 degrees C (59 and 86 degrees F). Throw away any unused medicine after the expiration date.  NOTE: This sheet is a summary. It may not cover all possible information. If you have questions about this medicine, talk to your doctor, pharmacist, or health care provider.   2019 Elsevier/Gold Standard (2015-05-07 12:00:25)

## 2018-11-27 NOTE — Progress Notes (Signed)
There were no vitals taken for this visit.   Subjective:    Patient ID: David Nicholson, male    DOB: 12-15-1960, 58 y.o.   MRN: [id number]  HPI: David Nicholson is a 58 y.o. male presenting on 11/27/2018 for No chief complaint on file.   HPI   This is a telemedicine visit through [employer] due to coronavirus pandemic  I connected with  David Nicholson on 11/27/18 by a video enabled telemedicine application and verified that I am speaking with the correct person using two identifiers.   I discussed the limitations of evaluation and management by telemedicine. The patient expressed understanding and agreed to proceed.  Pt is in his car.  Provider is at office   Pt says he is doing well.  He has been checking his bs at home and says that they are running 118-122  Pt says he wears his mask when he goes to the grocery store  Pt complains of ED.  He says he is able to get an erection but it doesn't last and he is not able to complete the sex act.   Relevant past medical, surgical, family and social history reviewed and updated as indicated. Interim medical history since our last visit reviewed. Allergies and medications reviewed and updated.   Current Outpatient Medications:  .  Insulin Glargine (LANTUS SOLOSTAR) 100 UNIT/ML Solostar Pen, Inject 20 Units into the skin daily., Disp: 1 pen, Rfl: 0 .  lisinopril-hydrochlorothiazide (PRINZIDE,ZESTORETIC) 20-12.5 MG tablet, Take 1 tablet by mouth daily., Disp: 30 tablet, Rfl: 5 .  metFORMIN (GLUCOPHAGE) 1000 MG tablet, Take 1 tablet (1,000 mg total) by mouth 2 (two) times daily with a meal., Disp: 180 tablet, Rfl: 1 .  simvastatin (ZOCOR) 20 MG tablet, Take 1 tablet (20 mg total) by mouth at bedtime., Disp: 30 tablet, Rfl: 4 .  omeprazole (PRILOSEC) 20 MG capsule, Take 20 mg by mouth every morning., Disp: , Rfl:     Review of Systems  Per HPI unless specifically indicated above     Objective:    There were no vitals taken for this  visit.  Wt Readings from Last 3 Encounters:  08/02/18 179 lb 8 oz (81.4 kg)  07/31/18 184 lb 6.4 oz (83.6 kg)  07/25/18 185 lb (83.9 kg)    Physical Exam Constitutional:      General: He is not in acute distress.    Appearance: Normal appearance. He is not ill-appearing.  HENT:     Head: Normocephalic and atraumatic.  Pulmonary:     Effort: Pulmonary effort is normal. No respiratory distress.  Neurological:     Mental Status: He is alert and oriented to person, place, and time.  Psychiatric:        Attention and Perception: Attention normal.        Mood and Affect: Mood normal.        Speech: Speech normal.        Behavior: Behavior is cooperative.        Cognition and Memory: Cognition normal.     Results for orders placed or performed during the hospital encounter of 11/24/18  Hemoglobin A1c  Result Value Ref Range   Hgb A1c MFr Bld 6.7 (H) 4.8 - 5.6 %   Mean Plasma Glucose 145.59 mg/dL  Lipid panel  Result Value Ref Range   Cholesterol 171 0 - 200 mg/dL   Triglycerides 285 (H) <150 mg/dL   HDL 54 >40 mg/dL   Total CHOL/HDL Ratio 3.2 RATIO  VLDL 57 (H) 0 - 40 mg/dL   LDL Cholesterol 60 0 - 99 mg/dL  Comprehensive metabolic panel  Result Value Ref Range   Sodium 142 135 - 145 mmol/L   Potassium 4.5 3.5 - 5.1 mmol/L   Chloride 107 98 - 111 mmol/L   CO2 26 22 - 32 mmol/L   Glucose, Bld 123 (H) 70 - 99 mg/dL   BUN 20 6 - 20 mg/dL   Creatinine, Ser 1.64 (H) 0.61 - 1.24 mg/dL   Calcium 9.5 8.9 - 10.3 mg/dL   Total Protein 6.5 6.5 - 8.1 g/dL   Albumin 4.0 3.5 - 5.0 g/dL   AST 18 15 - 41 U/L   ALT 28 0 - 44 U/L   Alkaline Phosphatase 44 38 - 126 U/L   Total Bilirubin 0.5 0.3 - 1.2 mg/dL   GFR calc non Af Amer 45 (L) >60 mL/min   GFR calc Af Amer 52 (L) >60 mL/min   Anion gap 9 5 - 15           Encounter Diagnoses  Name Primary?  . Type 2 diabetes mellitus with unspecified complications ([place]) Yes  . Essential hypertension   . Hyperlipidemia, unspecified  hyperlipidemia type   . Renal insufficiency   . Erectile dysfunction, unspecified erectile dysfunction type     -reviewed labs with pt   -will Refer to neprhologist.  Pt counseled to avoid nsaids  -pt to continue current medications  -discussed ED and proper use of viagra.  rx given  -pt encouraged to wear mask in public per CDC guidelines  -pt to follow up 3 months.  He is to contact office sooner prn

## 2018-12-25 ENCOUNTER — Other Ambulatory Visit: Payer: Self-pay | Admitting: Physician Assistant

## 2018-12-25 DIAGNOSIS — N289 Disorder of kidney and ureter, unspecified: Secondary | ICD-10-CM

## 2018-12-25 MED ORDER — SIMVASTATIN 20 MG PO TABS: 20.0000 mg | ORAL_TABLET | Freq: Every day | ORAL | 4 refills | Status: DC

## 2018-12-27 ENCOUNTER — Other Ambulatory Visit: Payer: Self-pay | Admitting: Physician Assistant

## 2018-12-27 MED ORDER — LISINOPRIL-HYDROCHLOROTHIAZIDE 20-12.5 MG PO TABS: 1.0000 | ORAL_TABLET | Freq: Every day | ORAL | 1 refills | Status: DC

## 2018-12-27 MED ORDER — LISINOPRIL-HYDROCHLOROTHIAZIDE 20-12.5 MG PO TABS: 1.0000 | ORAL_TABLET | Freq: Every day | ORAL | 5 refills | Status: DC

## 2018-12-27 MED ORDER — OMEPRAZOLE 20 MG PO CPDR: 20.0000 mg | DELAYED_RELEASE_CAPSULE | Freq: Every morning | ORAL | 1 refills | Status: DC

## 2018-12-27 MED ORDER — ATORVASTATIN CALCIUM 20 MG PO TABS: 20.0000 mg | ORAL_TABLET | Freq: Every day | ORAL | 1 refills | Status: DC

## 2019-01-31 NOTE — Congregational Nurse Program (Signed)
  Dept: [phone]   Congregational Nurse Program Note  Date of Encounter: 01/31/2019  Past Medical History: Past Medical History:  Diagnosis Date  . Anxiety   . Diabetes mellitus without complication ([place]) [id number]  . GERD (gastroesophageal reflux disease)   . Gout   . Hypertension   . Substance abuse [facility])     Encounter Details: CNP Questionnaire - 01/31/19 1055      Questionnaire   Patient Status  Not Applicable    Race  Black or African American    Location Patient Served At  [employer]  Not Applicable    Uninsured  Uninsured (NEW 1x/quarter)    Food  Yes, have food insecurities    Housing/Utilities  Yes, have permanent housing    Transportation  No transportation needs    Interpersonal Safety  Yes, feel physically and emotionally safe where you currently live    Medication  No medication insecurities   Gets Medication thru [facility] Provider  Yes   At [facility] right now; Laid off from job   Referrals  Not Applicable    ED Visit Averted  Not Applicable    Life-Saving Intervention Made  Not Applicable     Want Blood sugar and Blood pressure checked. Blood pressure 109/65 pulse 93.    Blood Sugar 149. States he  takes Insulin 20 units @ night. Sees Dr. At [facility].  Marko Plume [phone].

## 2019-02-19 ENCOUNTER — Other Ambulatory Visit: Payer: Self-pay | Admitting: Physician Assistant

## 2019-02-19 DIAGNOSIS — N289 Disorder of kidney and ureter, unspecified: Secondary | ICD-10-CM

## 2019-02-19 DIAGNOSIS — E118 Type 2 diabetes mellitus with unspecified complications: Secondary | ICD-10-CM

## 2019-02-19 DIAGNOSIS — E785 Hyperlipidemia, unspecified: Secondary | ICD-10-CM

## 2019-02-19 DIAGNOSIS — I10 Essential (primary) hypertension: Secondary | ICD-10-CM

## 2019-02-28 ENCOUNTER — Other Ambulatory Visit (HOSPITAL_COMMUNITY)
Admission: RE | Admit: 2019-02-28 | Discharge: 2019-02-28 | Disposition: A | Discharge status: Home / Self Care | Payer: Self-pay | Source: Ambulatory Visit | Attending: Physician Assistant | Admitting: Physician Assistant

## 2019-02-28 DIAGNOSIS — N289 Disorder of kidney and ureter, unspecified: Secondary | ICD-10-CM

## 2019-02-28 DIAGNOSIS — I10 Essential (primary) hypertension: Secondary | ICD-10-CM

## 2019-02-28 DIAGNOSIS — E785 Hyperlipidemia, unspecified: Secondary | ICD-10-CM

## 2019-02-28 DIAGNOSIS — E118 Type 2 diabetes mellitus with unspecified complications: Secondary | ICD-10-CM

## 2019-02-28 LAB — COMPREHENSIVE METABOLIC PANEL WITH GFR
ALT: 24 U/L (ref 0–44)
AST: 17 U/L (ref 15–41)
Albumin: 4.2 g/dL (ref 3.5–5.0)
Alkaline Phosphatase: 46 U/L (ref 38–126)
Anion gap: 9 (ref 5–15)
BUN: 22 mg/dL — ABNORMAL HIGH (ref 6–20)
CO2: 28 mmol/L (ref 22–32)
Calcium: 9.5 mg/dL (ref 8.9–10.3)
Chloride: 104 mmol/L (ref 98–111)
Creatinine, Ser: 1.57 mg/dL — ABNORMAL HIGH (ref 0.61–1.24)
GFR calc Af Amer: 55 mL/min — ABNORMAL LOW
GFR calc non Af Amer: 48 mL/min — ABNORMAL LOW
Glucose, Bld: 119 mg/dL — ABNORMAL HIGH (ref 70–99)
Potassium: 4.4 mmol/L (ref 3.5–5.1)
Sodium: 141 mmol/L (ref 135–145)
Total Bilirubin: 0.5 mg/dL (ref 0.3–1.2)
Total Protein: 6.8 g/dL (ref 6.5–8.1)

## 2019-02-28 LAB — LIPID PANEL
Cholesterol: 122 mg/dL (ref 0–200)
HDL: 43 mg/dL (ref >40)
LDL Cholesterol: 37 mg/dL (ref 0–99)
Total CHOL/HDL Ratio: 2.8 RATIO
Triglycerides: 208 mg/dL — ABNORMAL HIGH (ref <150)
VLDL: 42 mg/dL — ABNORMAL HIGH (ref 0–40)

## 2019-03-01 LAB — HEMOGLOBIN A1C
Hgb A1c MFr Bld: 6.1 % — ABNORMAL HIGH (ref 4.8–5.6)
Mean Plasma Glucose: 128 mg/dL

## 2019-03-05 ENCOUNTER — Ambulatory Visit: Payer: Self-pay | Admitting: Physician Assistant

## 2019-03-06 ENCOUNTER — Ambulatory Visit: Payer: Self-pay | Admitting: Physician Assistant

## 2019-03-06 ENCOUNTER — Encounter: Payer: Self-pay | Admitting: Physician Assistant

## 2019-03-06 ENCOUNTER — Other Ambulatory Visit: Payer: Self-pay

## 2019-03-06 VITALS — BP 120/64 | HR 83 | Temp 98.1°F | Wt 189.8 lb

## 2019-03-06 DIAGNOSIS — N289 Disorder of kidney and ureter, unspecified: Secondary | ICD-10-CM

## 2019-03-06 DIAGNOSIS — E118 Type 2 diabetes mellitus with unspecified complications: Secondary | ICD-10-CM

## 2019-03-06 DIAGNOSIS — N529 Male erectile dysfunction, unspecified: Secondary | ICD-10-CM

## 2019-03-06 DIAGNOSIS — E785 Hyperlipidemia, unspecified: Secondary | ICD-10-CM

## 2019-03-06 DIAGNOSIS — I10 Essential (primary) hypertension: Secondary | ICD-10-CM

## 2019-03-06 DIAGNOSIS — K0889 Other specified disorders of teeth and supporting structures: Secondary | ICD-10-CM

## 2019-03-06 DIAGNOSIS — Z125 Encounter for screening for malignant neoplasm of prostate: Secondary | ICD-10-CM

## 2019-03-06 MED ORDER — SILDENAFIL CITRATE 100 MG PO TABS: 50.0000 mg | ORAL_TABLET | Freq: Every day | ORAL | 3 refills | Status: DC | PRN

## 2019-03-06 NOTE — Progress Notes (Signed)
BP 120/64   Pulse 83   Temp 98.1 F (36.7 C)   Wt 189 lb 12.8 oz (86.1 kg)   SpO2 97%   BMI 26.85 kg/m    Subjective:    Patient ID: David Nicholson, male    DOB: 12-11-1960, 58 y.o.   MRN: [id number]  HPI: David Nicholson is a 58 y.o. male presenting on 03/06/2019 for Follow-up   HPI  Pt had a Negative covid 19 screening questionnaire  Pt is monitoring his fbs- it was 113 this am.  Lowest 111  Pt has toothache.  He has appt tomorrow for that.    Otherwise he has no complaints.    He is having no CP or SOB when he exerts himself.     Relevant past medical, surgical, family and social history reviewed and updated as indicated. Interim medical history since our last visit reviewed. Allergies and medications reviewed and updated.   Current Outpatient Medications:  .  atorvastatin (LIPITOR) 20 MG tablet, Take 1 tablet (20 mg total) by mouth daily., Disp: 90 tablet, Rfl: 1 .  Insulin Glargine (LANTUS SOLOSTAR) 100 UNIT/ML Solostar Pen, Inject 20 Units into the skin daily., Disp: 1 pen, Rfl: 0 .  lisinopril-hydrochlorothiazide (ZESTORETIC) 20-12.5 MG tablet, Take 1 tablet by mouth daily., Disp: 90 tablet, Rfl: 1 .  metFORMIN (GLUCOPHAGE) 1000 MG tablet, Take 1 tablet (1,000 mg total) by mouth 2 (two) times daily with a meal., Disp: 180 tablet, Rfl: 1 .  omeprazole (PRILOSEC) 20 MG capsule, Take 1 capsule (20 mg total) by mouth every morning., Disp: 90 capsule, Rfl: 1 .  sildenafil (VIAGRA) 100 MG tablet, Take 0.5-1 tablets (50-100 mg total) by mouth daily as needed for erectile dysfunction. (Patient not taking: Reported on 03/06/2019), Disp: 3 tablet, Rfl: 3     Review of Systems  Per HPI unless specifically indicated above     Objective:    BP 120/64   Pulse 83   Temp 98.1 F (36.7 C)   Wt 189 lb 12.8 oz (86.1 kg)   SpO2 97%   BMI 26.85 kg/m   Wt Readings from Last 3 Encounters:  03/06/19 189 lb 12.8 oz (86.1 kg)  08/02/18 179 lb 8 oz (81.4 kg)  07/31/18 184  lb 6.4 oz (83.6 kg)    Physical Exam Vitals signs reviewed.  Constitutional:      General: He is not in acute distress.    Appearance: Normal appearance. He is well-developed. He is not ill-appearing.  HENT:     Head: Normocephalic and atraumatic.  Neck:     Musculoskeletal: Neck supple.  Cardiovascular:     Rate and Rhythm: Normal rate and regular rhythm.  Pulmonary:     Effort: Pulmonary effort is normal.     Breath sounds: Normal breath sounds. No wheezing.  Abdominal:     General: Bowel sounds are normal.     Palpations: Abdomen is soft.     Tenderness: There is no abdominal tenderness.  Musculoskeletal:     Right lower leg: No edema.     Left lower leg: No edema.  Lymphadenopathy:     Cervical: No cervical adenopathy.  Skin:    General: Skin is warm and dry.  Neurological:     Mental Status: He is alert and oriented to person, place, and time.  Psychiatric:        Attention and Perception: Attention normal.        Speech: Speech normal.  Behavior: Behavior normal. Behavior is cooperative.     Results for orders placed or performed during the hospital encounter of 02/28/19  Hemoglobin A1c  Result Value Ref Range   Hgb A1c MFr Bld 6.1 (H) 4.8 - 5.6 %   Mean Plasma Glucose 128 mg/dL  Lipid panel  Result Value Ref Range   Cholesterol 122 0 - 200 mg/dL   Triglycerides 208 (H) <150 mg/dL   HDL 43 >40 mg/dL   Total CHOL/HDL Ratio 2.8 RATIO   VLDL 42 (H) 0 - 40 mg/dL   LDL Cholesterol 37 0 - 99 mg/dL  Comprehensive metabolic panel  Result Value Ref Range   Sodium 141 135 - 145 mmol/L   Potassium 4.4 3.5 - 5.1 mmol/L   Chloride 104 98 - 111 mmol/L   CO2 28 22 - 32 mmol/L   Glucose, Bld 119 (H) 70 - 99 mg/dL   BUN 22 (H) 6 - 20 mg/dL   Creatinine, Ser 1.57 (H) 0.61 - 1.24 mg/dL   Calcium 9.5 8.9 - 10.3 mg/dL   Total Protein 6.8 6.5 - 8.1 g/dL   Albumin 4.2 3.5 - 5.0 g/dL   AST 17 15 - 41 U/L   ALT 24 0 - 44 U/L   Alkaline Phosphatase 46 38 - 126 U/L    Total Bilirubin 0.5 0.3 - 1.2 mg/dL   GFR calc non Af Amer 48 (L) >60 mL/min   GFR calc Af Amer 55 (L) >60 mL/min   Anion gap 9 5 - 15      Assessment & Plan:    Encounter Diagnoses  Name Primary?  . Type 2 diabetes mellitus with unspecified complications ([place]) Yes  . Essential hypertension   . Hyperlipidemia, unspecified hyperlipidemia type   . Renal insufficiency   . Erectile dysfunction, unspecified erectile dysfunction type   . Dentalgia      -reviewed labs with pt  -Pt currently drinks 2-3 glasses water daily.  Recommend he increase to 5 or 6 glasses daily -scheduled pt for inFluenza vaccination -will refer for annual diabetic eye exam -pt to continue current medications -discussed renal function which has been waxing and waning.  If fails to improve at next OV will discuss referral to nephrologist -pt to follow up 3 months.  He is to contact office sooner prn

## 2019-04-16 ENCOUNTER — Other Ambulatory Visit: Payer: Self-pay | Admitting: Physician Assistant

## 2019-06-13 ENCOUNTER — Other Ambulatory Visit (HOSPITAL_COMMUNITY)
Admission: RE | Admit: 2019-06-13 | Discharge: 2019-06-13 | Disposition: A | Discharge status: Home / Self Care | Payer: Self-pay | Source: Ambulatory Visit | Attending: Physician Assistant | Admitting: Physician Assistant

## 2019-06-13 ENCOUNTER — Other Ambulatory Visit: Payer: Self-pay

## 2019-06-13 ENCOUNTER — Ambulatory Visit: Payer: Self-pay | Admitting: Physician Assistant

## 2019-06-13 DIAGNOSIS — E118 Type 2 diabetes mellitus with unspecified complications: Secondary | ICD-10-CM

## 2019-06-13 DIAGNOSIS — N289 Disorder of kidney and ureter, unspecified: Secondary | ICD-10-CM

## 2019-06-13 DIAGNOSIS — I10 Essential (primary) hypertension: Secondary | ICD-10-CM

## 2019-06-13 DIAGNOSIS — E785 Hyperlipidemia, unspecified: Secondary | ICD-10-CM

## 2019-06-13 DIAGNOSIS — Z125 Encounter for screening for malignant neoplasm of prostate: Secondary | ICD-10-CM

## 2019-06-13 LAB — COMPREHENSIVE METABOLIC PANEL
ALT: 28 U/L (ref 0–44)
AST: 25 U/L (ref 15–41)
Albumin: 4.2 g/dL (ref 3.5–5.0)
Alkaline Phosphatase: 53 U/L (ref 38–126)
Anion gap: 10 (ref 5–15)
BUN: 26 mg/dL — ABNORMAL HIGH (ref 6–20)
CO2: 27 mmol/L (ref 22–32)
Calcium: 9.1 mg/dL (ref 8.9–10.3)
Chloride: 100 mmol/L (ref 98–111)
Creatinine, Ser: 1.51 mg/dL — ABNORMAL HIGH (ref 0.61–1.24)
GFR calc Af Amer: 57 mL/min — ABNORMAL LOW (ref >60)
GFR calc non Af Amer: 49 mL/min — ABNORMAL LOW (ref >60)
Glucose, Bld: 97 mg/dL (ref 70–99)
Potassium: 4.3 mmol/L (ref 3.5–5.1)
Sodium: 137 mmol/L (ref 135–145)
Total Bilirubin: 0.8 mg/dL (ref 0.3–1.2)
Total Protein: 7.2 g/dL (ref 6.5–8.1)

## 2019-06-13 LAB — HEMOGLOBIN A1C
Hgb A1c MFr Bld: 6.6 % — ABNORMAL HIGH (ref 4.8–5.6)
Mean Plasma Glucose: 142.72 mg/dL

## 2019-06-13 LAB — LIPID PANEL
Cholesterol: 139 mg/dL (ref 0–200)
HDL: 52 mg/dL (ref >40)
LDL Cholesterol: 54 mg/dL (ref 0–99)
Total CHOL/HDL Ratio: 2.7 RATIO
Triglycerides: 166 mg/dL — ABNORMAL HIGH (ref <150)
VLDL: 33 mg/dL (ref 0–40)

## 2019-06-13 LAB — PSA: Prostatic Specific Antigen: 2.12 ng/mL (ref 0.00–4.00)

## 2019-06-14 LAB — MICROALBUMIN, URINE: Microalb, Ur: 6 ug/mL — ABNORMAL HIGH

## 2019-06-19 ENCOUNTER — Ambulatory Visit: Payer: Self-pay | Admitting: Physician Assistant

## 2019-09-19 LAB — HM DIABETES EYE EXAM

## 2019-09-20 ENCOUNTER — Other Ambulatory Visit: Payer: Self-pay

## 2019-09-20 ENCOUNTER — Ambulatory Visit: Payer: BC Managed Care – PPO | Attending: Internal Medicine

## 2019-09-20 DIAGNOSIS — Z20822 Contact with and (suspected) exposure to covid-19: Secondary | ICD-10-CM | POA: Insufficient documentation

## 2019-09-21 LAB — NOVEL CORONAVIRUS, NAA: SARS-CoV-2, NAA: NOT DETECTED

## 2019-09-21 LAB — SARS-COV-2, NAA 2 DAY TAT

## 2019-09-24 ENCOUNTER — Telehealth: Payer: Self-pay | Admitting: *Deleted

## 2019-09-24 NOTE — Telephone Encounter (Signed)
Reviewed negative Covid results with the patient. No further questions.

## 2019-09-26 ENCOUNTER — Telehealth: Payer: Self-pay | Admitting: *Deleted

## 2019-09-26 NOTE — Telephone Encounter (Signed)
Pt called requesting a copy of his COVID result; pt and his wife informed results are available on MyChart; link sent via tet message to wife's cell phone [phone]; pt's wife also given number to [facility] [phone], option 3; she verbalized understanding and receipt of text message.

## 2019-09-29 ENCOUNTER — Other Ambulatory Visit: Payer: Self-pay | Admitting: Physician Assistant

## 2019-10-04 ENCOUNTER — Other Ambulatory Visit: Payer: Self-pay

## 2019-10-04 ENCOUNTER — Ambulatory Visit (INDEPENDENT_AMBULATORY_CARE_PROVIDER_SITE_OTHER)
Admission: RE | Admit: 2019-10-04 | Discharge: 2019-10-04 | Disposition: A | Discharge status: Home / Self Care | Payer: Self-pay | Source: Ambulatory Visit

## 2019-10-04 DIAGNOSIS — E1129 Type 2 diabetes mellitus with other diabetic kidney complication: Secondary | ICD-10-CM

## 2019-10-04 DIAGNOSIS — Z794 Long term (current) use of insulin: Secondary | ICD-10-CM

## 2019-10-04 DIAGNOSIS — I10 Essential (primary) hypertension: Secondary | ICD-10-CM

## 2019-10-04 DIAGNOSIS — Z79899 Other long term (current) drug therapy: Secondary | ICD-10-CM

## 2019-10-04 DIAGNOSIS — E785 Hyperlipidemia, unspecified: Secondary | ICD-10-CM

## 2019-10-04 DIAGNOSIS — Z87891 Personal history of nicotine dependence: Secondary | ICD-10-CM

## 2019-10-04 DIAGNOSIS — Z760 Encounter for issue of repeat prescription: Secondary | ICD-10-CM

## 2019-10-04 DIAGNOSIS — K219 Gastro-esophageal reflux disease without esophagitis: Secondary | ICD-10-CM

## 2019-10-04 MED ORDER — METFORMIN HCL 1000 MG PO TABS: ORAL_TABLET | ORAL | 0 refills | Status: DC

## 2019-10-04 MED ORDER — LISINOPRIL-HYDROCHLOROTHIAZIDE 20-12.5 MG PO TABS: ORAL_TABLET | ORAL | 0 refills | Status: DC

## 2019-10-04 MED ORDER — ATORVASTATIN CALCIUM 20 MG PO TABS: ORAL_TABLET | ORAL | 0 refills | Status: DC

## 2019-10-04 MED ORDER — OMEPRAZOLE 20 MG PO CPDR: DELAYED_RELEASE_CAPSULE | ORAL | 0 refills | Status: DC

## 2019-10-04 MED ORDER — LANTUS SOLOSTAR 100 UNIT/ML ~~LOC~~ SOPN: 20.0000 [IU] | PEN_INJECTOR | Freq: Every day | SUBCUTANEOUS | 0 refills | Status: DC

## 2019-10-04 NOTE — ED Provider Notes (Signed)
Virtual Visit via Video Note:  David Nicholson  initiated request for Telemedicine visit with [facility] team. I connected with David Nicholson  on 10/04/2019 at 10:10 AM  for a synchronized telemedicine visit using a video enabled HIPPA compliant telemedicine application. I verified that I am speaking with David Nicholson  using two identifiers. Sharion Balloon, NP  was physically located in a [facility] site and David Nicholson was located at a different location.   The limitations of evaluation and management by telemedicine as well as the availability of in-person appointments were discussed. Patient was informed that he  may incur a bill ( including co-pay) for this virtual visit encounter. David Nicholson  expressed understanding and gave verbal consent to proceed with virtual visit.     History of Present Illness:David Nicholson  is a 59 y.o. male presents with request for refills of his medications: Lantus, metformin, omeprazole, atorvastatin, and Zestretic.  He states he does not have a PCP at this time but is trying to establish with one.  He denies symptoms.  Last seen by a primary care on 03/06/2019; blood pressure normal at that time.  Labs on 06/13/2019, A1c 6.6, creatinine 1.51, BUN 26, GFR 57.     No Known Allergies   Past Medical History:  Diagnosis Date  . Anxiety   . Diabetes mellitus without complication ([place]) A999333  . GERD (gastroesophageal reflux disease)   . Gout   . Hypertension   . Substance abuse ([place])      Social History   Tobacco Use  . Smoking status: Former Smoker    Packs/day: 0.75    Years: 48.00    Pack years: 36.00    Types: Cigarettes    Quit date: 02/2018    Years since quitting: 1.6  . Smokeless tobacco: Never Used  Substance Use Topics  . Alcohol use: Not Currently    Comment: none since Sept 2019. hx etoh  . Drug use: Not Currently    Types: Cocaine, Marijuana, Oxycodone    Comment: none since 02/2018.     ROS:  as stated in HPI.  All other systems reviewed and negative.       Observations/Objective: Physical Exam  VITALS: Patient denies fever. GENERAL: Alert, appears well and in no acute distress. HEENT: Atraumatic. NECK: Normal movements of the head and neck. CARDIOPULMONARY: No increased WOB. Speaking in clear sentences. I:E ratio WNL.  MS: Moves all visible extremities without noticeable abnormality. PSYCH: Pleasant and cooperative, well-groomed. Speech normal rate and rhythm. Affect is appropriate. Insight and judgement are appropriate. Attention is focused, linear, and appropriate.  NEURO: CN grossly intact. Oriented as arrived to appointment on time with no prompting. Moves both UE equally.  SKIN: No obvious lesions, wounds, erythema, or cyanosis noted on face or hands.   Assessment and Plan:    ICD-10-CM   1. Encounter for medication refill  Z76.0   2. Essential hypertension  I10   3. Gastroesophageal reflux disease, unspecified whether esophagitis present  K21.9   4. Type 2 diabetes mellitus with microalbuminuria, with long-term current use of insulin (HCC)  E11.29    R80.9    Z79.4   5. Hyperlipidemia, unspecified hyperlipidemia type  E78.5        Follow Up Instructions: 30 day supply of medication refills provided on Lantus, metformin, atorvastatin, lisinopril-HCTZ, and omeprazole.  Instructed patient to establish with a PCP.  Explained that his medical conditions and medications require ongoing follow-up and  diagnostics.  Patient agrees to plan of care.      I discussed the assessment and treatment plan with the patient. The patient was provided an opportunity to ask questions and all were answered. The patient agreed with the plan and demonstrated an understanding of the instructions.   The patient was advised to call back or seek an in-person evaluation if the symptoms worsen or if the condition fails to improve as anticipated.      Sharion Balloon, NP  10/04/2019 10:10  AM         Sharion Balloon, NP 10/04/19 1010

## 2019-10-04 NOTE — Discharge Instructions (Addendum)
Take the medications as directed.  Establish a primary care provider.

## 2019-10-05 ENCOUNTER — Telehealth (HOSPITAL_COMMUNITY): Payer: Self-pay

## 2019-10-15 ENCOUNTER — Encounter (INDEPENDENT_AMBULATORY_CARE_PROVIDER_SITE_OTHER): Payer: Self-pay | Admitting: Internal Medicine

## 2019-10-15 ENCOUNTER — Ambulatory Visit (INDEPENDENT_AMBULATORY_CARE_PROVIDER_SITE_OTHER): Payer: BC Managed Care – PPO | Admitting: Internal Medicine

## 2019-10-15 ENCOUNTER — Other Ambulatory Visit: Payer: Self-pay

## 2019-10-15 VITALS — BP 132/78 | HR 78 | Temp 97.9°F | Resp 18 | Ht 70.0 in | Wt 176.4 lb

## 2019-10-15 DIAGNOSIS — N183 Chronic kidney disease, stage 3 unspecified: Secondary | ICD-10-CM

## 2019-10-15 DIAGNOSIS — I10 Essential (primary) hypertension: Secondary | ICD-10-CM | POA: Diagnosis not present

## 2019-10-15 DIAGNOSIS — E785 Hyperlipidemia, unspecified: Secondary | ICD-10-CM

## 2019-10-15 DIAGNOSIS — E782 Mixed hyperlipidemia: Secondary | ICD-10-CM | POA: Diagnosis not present

## 2019-10-15 DIAGNOSIS — E1100 Type 2 diabetes mellitus with hyperosmolarity without nonketotic hyperglycemic-hyperosmolar coma (NKHHC): Secondary | ICD-10-CM | POA: Diagnosis not present

## 2019-10-15 DIAGNOSIS — N1831 Chronic kidney disease, stage 3a: Secondary | ICD-10-CM

## 2019-10-15 HISTORY — DX: Chronic kidney disease, stage 3 unspecified: N18.30

## 2019-10-15 HISTORY — DX: Hyperlipidemia, unspecified: E78.5

## 2019-10-15 NOTE — Progress Notes (Signed)
Metrics: Intervention Frequency ACO  Documented Smoking Status Yearly  Screened one or more times in 24 months  Cessation Counseling or  Active cessation medication Past 24 months  Past 24 months   Guideline developer: UpToDate (See UpToDate for funding source) Date Released: 2014       Wellness Office Visit  Subjective:  Patient ID: David Nicholson, male    DOB: [birth year]  Age: 59 y.o. MRN: [id number]  CC: This is a 59 year old man who comes to our practice as a new patient to be established. HPI  He apparently was the patient at the free clinic but then he obtained commercial health insurance at which point he was advised to seek a new primary care provider. He has type 2 diabetes which was diagnosed just over 1 year ago and surprisingly is already on insulin.  His last hemoglobin A1c was less than 7%. He appears to have chronic kidney disease based on blood work. He has not been checking his sugars on a regular basis. He has run out of insulin and says he needs insulin at the present time. He also continues on statin therapy in the face of diabetes. He continues on antihypertensive therapy for his hypertension. He does have a history of gastroesophageal reflux disease but does not feel that it is a problem right now. He has a previous history of substance abuse involving cocaine but says that he is not abusing this at the present time. Past Medical History:  Diagnosis Date  . Anxiety   . CKD (chronic kidney disease) stage 3, GFR 30-59 ml/min 10/15/2019  . Diabetes mellitus without complication ([place]) [id number]  . GERD (gastroesophageal reflux disease)   . Gout   . HLD (hyperlipidemia) 10/15/2019  . Hypertension   . Substance abuse ([place])       Family History  Problem Relation Age of Onset  . Diabetes Mother   . Hypertension Mother   . Diabetes Sister   . Hypertension Sister   . Diabetes Brother   . Hypertension Brother   . 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   Social History   Tobacco Use  . Smoking status: Current Every Day Smoker    Packs/day: 0.25    Years: 48.00    Pack years: 12.00    Types: Cigarettes    Last attempt to quit: 02/2018    Years since quitting: 1.6  . Smokeless tobacco: Never Used  Substance Use Topics  . Alcohol use: Not Currently    Alcohol/week: 7.0 standard drinks    Types: 7 Cans of beer per week    Current Meds  Medication Sig  . atorvastatin (LIPITOR) 20 MG tablet TAKE 1 Tablet BY MOUTH ONCE EVERY DAY  . insulin glargine (LANTUS SOLOSTAR) 100 UNIT/ML Solostar Pen Inject 20 Units into the skin daily.  Marland Kitchen lisinopril-hydrochlorothiazide (ZESTORETIC) 20-12.5 MG tablet TAKE 1 Tablet BY MOUTH ONCE EVERY DAY  . metFORMIN (GLUCOPHAGE) 1000 MG tablet TAKE 1 Tablet  BY MOUTH TWICE DAILY WITH A MEAL  . sildenafil (VIAGRA) 100 MG tablet Take 0.5-1 tablets (50-100 mg total) by mouth daily as needed for erectile dysfunction.  . [DISCONTINUED] omeprazole (PRILOSEC) 20 MG capsule TAKE 1 Capsule BY MOUTH ONCE EVERY MORNING      Objective:   Today's Vitals: BP 132/78 (BP Location: Left Arm, Patient Position: Sitting, Cuff  Size: Normal)   Pulse 78   Temp 97.9 F (36.6 C) (Temporal)   Resp 18   Ht 5\' 10"  (1.778 m)   Wt 176 lb 6.4 oz (80 kg)   SpO2 98%   BMI 25.31 kg/m  Vitals with BMI 10/15/2019 03/06/2019 08/17/2018  Height 5\' 10"  - -  Weight 176 lbs 6 oz 189 lbs 13 oz -  BMI 123XX123 - -  Systolic [id number] [id number] A999333  Diastolic 78 64 80  Pulse 78 83 76     Physical Exam   He looks systemically well.  He has lost weight of 13 pounds since September of last year.  His blood pressure is reasonable.    Assessment   1. Essential hypertension   2. Stage 3a chronic kidney disease   3. Diabetes mellitus with hyperosmolarity without hyperglycemic hyperosmolar  nonketotic coma ([place])   4. Mixed hyperlipidemia       Tests ordered Orders Placed This Encounter  Procedures  . COMPLETE METABOLIC PANEL WITH GFR  . Hemoglobin A1c  . Lipid panel     Plan: 1. Blood work is ordered. 2. He will continue with the same antihypertensive therapy for the time being.  This seems to be controlling his blood pressure reasonably well. 3. We will check his hemoglobin A1c and I have told him that I do not think he should be taking insulin and we will work with diet and the other medications he is taking. 4. He will continue with statin therapy and we will check a lipid panel today. 5. I will see him in the next couple of weeks to reevaluate him and make further recommendations.   No orders of the defined types were placed in this encounter.   Doree Albee, MD

## 2019-10-16 ENCOUNTER — Other Ambulatory Visit (INDEPENDENT_AMBULATORY_CARE_PROVIDER_SITE_OTHER): Payer: Self-pay | Admitting: Internal Medicine

## 2019-10-16 LAB — COMPLETE METABOLIC PANEL WITH GFR
AG Ratio: 2.2 (calc) (ref 1.0–2.5)
ALT: 23 U/L (ref 9–46)
AST: 16 U/L (ref 10–35)
Albumin: 4.4 g/dL (ref 3.6–5.1)
Alkaline phosphatase (APISO): 60 U/L (ref 35–144)
BUN/Creatinine Ratio: 13 (calc) (ref 6–22)
BUN: 23 mg/dL (ref 7–25)
CO2: 25 mmol/L (ref 20–32)
Calcium: 9.6 mg/dL (ref 8.6–10.3)
Chloride: 104 mmol/L (ref 98–110)
Creat: 1.77 mg/dL — ABNORMAL HIGH (ref 0.70–1.25)
GFR, Est African American: 47 mL/min/{1.73_m2} — ABNORMAL LOW (ref >=60)
GFR, Est Non African American: 41 mL/min/{1.73_m2} — ABNORMAL LOW (ref >=60)
Globulin: 2 g/dL (calc) (ref 1.9–3.7)
Glucose, Bld: 100 mg/dL — ABNORMAL HIGH (ref 65–99)
Potassium: 4.6 mmol/L (ref 3.5–5.3)
Sodium: 140 mmol/L (ref 135–146)
Total Bilirubin: 0.3 mg/dL (ref 0.2–1.2)
Total Protein: 6.4 g/dL (ref 6.1–8.1)

## 2019-10-16 LAB — LIPID PANEL
Cholesterol: 157 mg/dL (ref <200)
HDL: 61 mg/dL (ref >=40)
Non-HDL Cholesterol (Calc): 96 mg/dL (calc) (ref <130)
Total CHOL/HDL Ratio: 2.6 (calc) (ref <5.0)
Triglycerides: 544 mg/dL — ABNORMAL HIGH (ref <150)

## 2019-10-16 LAB — HEMOGLOBIN A1C
Hgb A1c MFr Bld: 5.8 % of total Hgb — ABNORMAL HIGH (ref <5.7)
Mean Plasma Glucose: 120 (calc)
eAG (mmol/L): 6.6 (calc)

## 2019-10-29 ENCOUNTER — Other Ambulatory Visit: Payer: Self-pay

## 2019-10-29 ENCOUNTER — Ambulatory Visit (INDEPENDENT_AMBULATORY_CARE_PROVIDER_SITE_OTHER): Payer: BC Managed Care – PPO | Admitting: Internal Medicine

## 2019-10-29 ENCOUNTER — Encounter (INDEPENDENT_AMBULATORY_CARE_PROVIDER_SITE_OTHER): Payer: Self-pay | Admitting: Internal Medicine

## 2019-10-29 VITALS — BP 146/90 | HR 97 | Temp 96.0°F | Resp 20 | Ht 71.0 in | Wt 174.2 lb

## 2019-10-29 DIAGNOSIS — N1831 Chronic kidney disease, stage 3a: Secondary | ICD-10-CM | POA: Diagnosis not present

## 2019-10-29 DIAGNOSIS — E1100 Type 2 diabetes mellitus with hyperosmolarity without nonketotic hyperglycemic-hyperosmolar coma (NKHHC): Secondary | ICD-10-CM | POA: Diagnosis not present

## 2019-10-29 DIAGNOSIS — I10 Essential (primary) hypertension: Secondary | ICD-10-CM | POA: Diagnosis not present

## 2019-10-29 DIAGNOSIS — M109 Gout, unspecified: Secondary | ICD-10-CM | POA: Diagnosis not present

## 2019-10-29 MED ORDER — PREDNISONE 20 MG PO TABS: 40.0000 mg | ORAL_TABLET | Freq: Every day | ORAL | 1 refills | Status: DC

## 2019-10-29 MED ORDER — AMLODIPINE BESYLATE 5 MG PO TABS: 5.0000 mg | ORAL_TABLET | Freq: Every day | ORAL | 3 refills | Status: DC

## 2019-10-29 NOTE — Progress Notes (Signed)
Metrics: Intervention Frequency ACO  Documented Smoking Status Yearly  Screened one or more times in 24 months  Cessation Counseling or  Active cessation medication Past 24 months  Past 24 months   Guideline developer: UpToDate (See UpToDate for funding source) Date Released: 2014       Wellness Office Visit  Subjective:  Patient ID: David Nicholson, male    DOB: 05-Aug-1960  Age: 59 y.o. MRN: [id number]  CC: This man comes in for close follow-up regarding his diabetes, chronic kidney disease and hypertension. HPI  He has now gotten acute problem which is gout involving the right big toe.  He has had gout before.  It is very painful. I reviewed his blood work with him which shows good control of his diabetes with a hemoglobin A1c of 5.8%.  I told him on the last visit to discontinue insulin.  He has done so.  Past Medical History:  Diagnosis Date  . Anxiety   . CKD (chronic kidney disease) stage 3, GFR 30-59 ml/min 10/15/2019  . Diabetes mellitus without complication ([place]) [id number]  . GERD (gastroesophageal reflux disease)   . Gout   . HLD (hyperlipidemia) 10/15/2019  . Hypertension   . Substance abuse ([place])       Family History  Problem Relation Age of Onset  . Diabetes Mother   . Hypertension Mother   . Diabetes Sister   . Hypertension Sister   . Diabetes Brother   . Hypertension Brother   . 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   Social History   Tobacco Use  . Smoking status: Current Every Day Smoker    Packs/day: 0.25    Years: 48.00    Pack years: 12.00    Types: Cigarettes    Last attempt to quit: 02/2018    Years since quitting: 1.7  . Smokeless tobacco: Never Used  Substance Use Topics  . Alcohol use: Not Currently    Alcohol/week: 7.0 standard drinks    Types: 7 Cans of beer per week     Current Meds  Medication Sig  . atorvastatin (LIPITOR) 20 MG tablet TAKE 1 Tablet BY MOUTH ONCE EVERY DAY  . lisinopril-hydrochlorothiazide (ZESTORETIC) 20-12.5 MG tablet TAKE 1 Tablet BY MOUTH ONCE EVERY DAY  . metFORMIN (GLUCOPHAGE) 1000 MG tablet TAKE 1 Tablet  BY MOUTH TWICE DAILY WITH A MEAL  . sildenafil (VIAGRA) 100 MG tablet Take 0.5-1 tablets (50-100 mg total) by mouth daily as needed for erectile dysfunction.       No flowsheet data found.   Objective:   Today's Vitals: BP (!) 146/90 (BP Location: Right Arm, Patient Position: Sitting, Cuff Size: Normal)   Pulse 97   Temp (!) 96 F (35.6 C) (Temporal)   Resp 20   Ht 5\' 11"  (1.803 m)   Wt 174 lb 3.2 oz (79 kg)   SpO2 98%   BMI 24.30 kg/m  Vitals with BMI 10/29/2019 10/15/2019 03/06/2019  Height 5\' 11"  5\' 10"  -  Weight 174 lbs 3 oz 176 lbs 6 oz 189 lbs 13 oz  BMI 0000000 123XX123 -  Systolic 123456 Q000111Q 123456  Diastolic 90 78 64  Pulse 97 78 83     Physical Exam  He looks systemically well.  His blood pressure is elevated today.  He is alert and oriented. Examination of his right  foot shows gout in the right toe involving the metatarsal phalangeal joint.     Assessment   1. Acute gout involving toe of right foot, unspecified cause   2. Stage 3a chronic kidney disease   3. Essential hypertension   4. Diabetes mellitus with hyperosmolarity without hyperglycemic hyperosmolar nonketotic coma ([place])       Tests ordered No orders of the defined types were placed in this encounter.    Plan: 1. For the gout, I am been to treat him with a short course of prednisone.  I explained possible side effects, including worsening of his diabetes temporarily. 2. In view of his worsening kidney function and hypertension, I am going to discontinue Zestoretic and replace this with amlodipine 5 mg daily. 3. As far as his diabetes is concerned, we will discontinue Metformin in the face of worsening kidney function and we discussed  nutrition and the concept of intermittent fasting.  I have also given him a diet sheet.  I want him to fast for 16 hours every day, maintaining hydration. 4. I will see him in about a month's time for close follow-up to see how he is doing.  We will check blood work then including renal function.   Meds ordered this encounter  Medications  . predniSONE (DELTASONE) 20 MG tablet    Sig: Take 2 tablets (40 mg total) by mouth daily with breakfast.    Dispense:  10 tablet    Refill:  1  . amLODipine (NORVASC) 5 MG tablet    Sig: Take 1 tablet (5 mg total) by mouth daily.    Dispense:  30 tablet    Refill:  3    Shauntelle Jamerson Luther Parody, MD

## 2019-10-29 NOTE — Patient Instructions (Addendum)
[facility] Dietary Recommendations for Weight Loss What to Avoid . Avoid added sugars o Often added sugar can be found in processed foods such as many condiments, dry cereals, cakes, cookies, chips, crisps, crackers, candies, sweetened drinks, etc.  o Read labels and AVOID/DECREASE use of foods with the following in their ingredient list: Sugar, fructose, high fructose corn syrup, sucrose, glucose, maltose, dextrose, molasses, cane sugar, brown sugar, any type of syrup, agave nectar, etc.   . Avoid snacking in between meals . Avoid foods made with flour o If you are going to eat food made with flour, choose those made with whole-grains; and, minimize your consumption as much as is tolerable . Avoid processed foods o These foods are generally stocked in the middle of the grocery store. Focus on shopping on the perimeter of the grocery.  . Avoid Meat  o We recommend following a plant-based diet at [facility]. Thus, we recommend avoiding meat as a general rule. Consider eating beans, legumes, eggs, and/or dairy products for regular protein sources o If you plan on eating meat limit to 4 ounces of meat at a time and choose lean options such as Fish, chicken, turkey. Avoid red meat intake such as pork and/or steak What to Include . Vegetables o GREEN LEAFY VEGETABLES: Kale, spinach, mustard greens, collard greens, cabbage, broccoli, etc. o OTHER: Asparagus, cauliflower, eggplant, carrots, peas, Brussel sprouts, tomatoes, bell peppers, zucchini, beets, cucumbers, etc. . Grains, seeds, and legumes o Beans: kidney beans, black eyed peas, garbanzo beans, black beans, pinto beans, etc. o Whole, unrefined grains: brown rice, barley, bulgur, oatmeal, etc. . Healthy fats  o Avoid highly processed fats such as vegetable oil o Examples of healthy fats: avocado, olives, virgin olive oil, dark chocolate (?72% Cocoa), nuts (peanuts, almonds, walnuts, cashews, pecans, etc.) . None to Low  Intake of Animal Sources of Protein o Meat sources: chicken, turkey, salmon, tuna. Limit to 4 ounces of meat at one time. o Consider limiting dairy sources, but when choosing dairy focus on: PLAIN Greek yogurt, cottage cheese, high-protein milk . Fruit o Choose berries  When to Eat . Intermittent Fasting: o Choosing not to eat for a specific time period, but DO FOCUS ON HYDRATION when fasting o Multiple Techniques: - Time Restricted Eating: eat 3 meals in a day, each meal lasting no more than 60 minutes, no snacks between meals - 16-18 hour fast: fast for 16 to 18 hours up to 7 days a week. Often suggested to start with 2-3 nonconsecutive days per week.  . Remember the time you sleep is counted as fasting.  . Examples of eating schedule: Fast from 7:00pm-11:00am. Eat between 11:00am-7:00pm.  - 24-hour fast: fast for 24 hours up to every other day. Often suggested to start with 1 day per week . Remember the time you sleep is counted as fasting . Examples of eating schedule:  o Eating day: eat 2-3 meals on your eating day. If doing 2 meals, each meal should last no more than 90 minutes. If doing 3 meals, each meal should last no more than 60 minutes. Finish last meal by 7:00pm. o Fasting day: Fast until 7:00pm.  o IF YOU FEEL UNWELL FOR ANY REASON/IN ANY WAY WHEN FASTING, STOP FASTING BY EATING A NUTRITIOUS SNACK OR LIGHT MEAL o ALWAYS FOCUS ON HYDRATION DURING FASTS - Acceptable Hydration sources: water, broths, tea/coffee (black tea/coffee is best but using a small amount of whole-fat dairy products in coffee/tea is acceptable).  -   Poor Hydration Sources: anything with sugar or artificial sweeteners added to it  These recommendations have been developed for patients that are actively receiving medical care from either Dr. Anastasio Champion or Jeralyn Ruths, DNP, NP-C at [facility]. These recommendations are developed for patients with specific medical conditions and are not meant to be  distributed or used by others that are not actively receiving care from either provider listed above at [facility]. It is not appropriate to participate in the above eating plans without proper medical supervision.   Reference: Rexanne Mano. The obesity code. [place]; 2016.  STOP Metformin and Zestoretic START Amlodopine(for blood pressure)  and Prednisone (for gout).

## 2019-11-11 ENCOUNTER — Encounter (HOSPITAL_COMMUNITY): Payer: Self-pay | Admitting: Emergency Medicine

## 2019-11-11 ENCOUNTER — Emergency Department (HOSPITAL_COMMUNITY): Payer: Self-pay

## 2019-11-11 ENCOUNTER — Emergency Department (HOSPITAL_COMMUNITY)
Admission: EM | Admit: 2019-11-11 | Discharge: 2019-11-11 | Disposition: A | Discharge status: Home / Self Care | Payer: Self-pay | Attending: Emergency Medicine | Admitting: Emergency Medicine

## 2019-11-11 ENCOUNTER — Other Ambulatory Visit: Payer: Self-pay

## 2019-11-11 DIAGNOSIS — R109 Unspecified abdominal pain: Secondary | ICD-10-CM | POA: Insufficient documentation

## 2019-11-11 DIAGNOSIS — Z7984 Long term (current) use of oral hypoglycemic drugs: Secondary | ICD-10-CM | POA: Insufficient documentation

## 2019-11-11 DIAGNOSIS — Z79899 Other long term (current) drug therapy: Secondary | ICD-10-CM | POA: Insufficient documentation

## 2019-11-11 DIAGNOSIS — N183 Chronic kidney disease, stage 3 unspecified: Secondary | ICD-10-CM | POA: Insufficient documentation

## 2019-11-11 DIAGNOSIS — E1122 Type 2 diabetes mellitus with diabetic chronic kidney disease: Secondary | ICD-10-CM | POA: Insufficient documentation

## 2019-11-11 DIAGNOSIS — I129 Hypertensive chronic kidney disease with stage 1 through stage 4 chronic kidney disease, or unspecified chronic kidney disease: Secondary | ICD-10-CM | POA: Insufficient documentation

## 2019-11-11 LAB — CBC WITH DIFFERENTIAL/PLATELET
Abs Immature Granulocytes: 0.07 10*3/uL (ref 0.00–0.07)
Basophils Absolute: 0 10*3/uL (ref 0.0–0.1)
Basophils Relative: 0 %
Eosinophils Absolute: 0.1 10*3/uL (ref 0.0–0.5)
Eosinophils Relative: 1 %
HCT: 39.1 % (ref 39.0–52.0)
Hemoglobin: 12.3 g/dL — ABNORMAL LOW (ref 13.0–17.0)
Immature Granulocytes: 1 %
Lymphocytes Relative: 14 %
Lymphs Abs: 1.8 10*3/uL (ref 0.7–4.0)
MCH: 27.6 pg (ref 26.0–34.0)
MCHC: 31.5 g/dL (ref 30.0–36.0)
MCV: 87.9 fL (ref 80.0–100.0)
Monocytes Absolute: 1 10*3/uL (ref 0.1–1.0)
Monocytes Relative: 7 %
Neutro Abs: 10.6 10*3/uL — ABNORMAL HIGH (ref 1.7–7.7)
Neutrophils Relative %: 77 %
Platelets: 343 10*3/uL (ref 150–400)
RBC: 4.45 MIL/uL (ref 4.22–5.81)
RDW: 14.4 % (ref 11.5–15.5)
WBC: 13.6 10*3/uL — ABNORMAL HIGH (ref 4.0–10.5)
nRBC: 0 % (ref 0.0–0.2)

## 2019-11-11 LAB — COMPREHENSIVE METABOLIC PANEL
ALT: 31 U/L (ref 0–44)
AST: 22 U/L (ref 15–41)
Albumin: 4.1 g/dL (ref 3.5–5.0)
Alkaline Phosphatase: 62 U/L (ref 38–126)
Anion gap: 14 (ref 5–15)
BUN: 18 mg/dL (ref 6–20)
CO2: 22 mmol/L (ref 22–32)
Calcium: 9.5 mg/dL (ref 8.9–10.3)
Chloride: 99 mmol/L (ref 98–111)
Creatinine, Ser: 1.4 mg/dL — ABNORMAL HIGH (ref 0.61–1.24)
GFR calc Af Amer: >60 mL/min (ref >60)
GFR calc non Af Amer: 54 mL/min — ABNORMAL LOW (ref >60)
Glucose, Bld: 101 mg/dL — ABNORMAL HIGH (ref 70–99)
Potassium: 4 mmol/L (ref 3.5–5.1)
Sodium: 135 mmol/L (ref 135–145)
Total Bilirubin: 0.6 mg/dL (ref 0.3–1.2)
Total Protein: 7.6 g/dL (ref 6.5–8.1)

## 2019-11-11 LAB — URINALYSIS, ROUTINE W REFLEX MICROSCOPIC
Bilirubin Urine: NEGATIVE
Glucose, UA: NEGATIVE mg/dL
Hgb urine dipstick: NEGATIVE
Ketones, ur: NEGATIVE mg/dL
Leukocytes,Ua: NEGATIVE
Nitrite: NEGATIVE
Protein, ur: NEGATIVE mg/dL
Specific Gravity, Urine: 1.015 (ref 1.005–1.030)
pH: 5 (ref 5.0–8.0)

## 2019-11-11 MED ORDER — LORAZEPAM 2 MG/ML IJ SOLN
0.5000 mg | Freq: Once | INTRAMUSCULAR | Status: AC
  Administered 2019-11-11: 0.5 mg via INTRAVENOUS
  Filled 2019-11-11: qty 1

## 2019-11-11 MED ORDER — HYDROCODONE-ACETAMINOPHEN 5-325 MG PO TABS: 1.0000 | ORAL_TABLET | Freq: Four times a day (QID) | ORAL | 0 refills | Status: DC | PRN

## 2019-11-11 MED ORDER — CYCLOBENZAPRINE HCL 10 MG PO TABS: 10.0000 mg | ORAL_TABLET | Freq: Three times a day (TID) | ORAL | 0 refills | Status: DC | PRN

## 2019-11-11 MED ORDER — ONDANSETRON HCL 4 MG/2ML IJ SOLN
4.0000 mg | Freq: Once | INTRAMUSCULAR | Status: AC
  Administered 2019-11-11: 4 mg via INTRAVENOUS
  Filled 2019-11-11: qty 2

## 2019-11-11 MED ORDER — HYDROMORPHONE HCL 1 MG/ML IJ SOLN
1.0000 mg | Freq: Once | INTRAMUSCULAR | Status: AC
  Administered 2019-11-11: 1 mg via INTRAVENOUS
  Filled 2019-11-11: qty 1

## 2019-11-11 NOTE — ED Provider Notes (Signed)
[facility] EMERGENCY DEPARTMENT Provider Note   CSN: [id number] Arrival date & time: 11/11/19  1442     History Chief Complaint  Patient presents with  . Flank Pain    David Nicholson is a 59 y.o. male.  Patient complains of right flank pain.  Patient does not remember doing thing to his back.  It hurts with movement  The history is provided by the patient. No language interpreter was used.  Flank Pain This is a new problem. The current episode started 12 to 24 hours ago. The problem occurs constantly. The problem has not changed since onset.Associated symptoms include chest pain. Pertinent negatives include no abdominal pain and no headaches. Exacerbated by: Movement. Nothing relieves the symptoms. He has tried nothing for the symptoms. The treatment provided no relief.       Past Medical History:  Diagnosis Date  . Anxiety   . CKD (chronic kidney disease) stage 3, GFR 30-59 ml/min 10/15/2019  . Diabetes mellitus without complication ([place]) 40/98/1191  . GERD (gastroesophageal reflux disease)   . Gout   . HLD (hyperlipidemia) 10/15/2019  . Hypertension   . Substance abuse [facility])     Patient Active Problem List   Diagnosis Date Noted  . CKD (chronic kidney disease) stage 3, GFR 30-59 ml/min 10/15/2019  . HLD (hyperlipidemia) 10/15/2019  . Diabetes mellitus with hyperosmolarity without hyperglycemic hyperosmolar nonketotic coma ([place]) 07/25/2018  . Hypertension 07/25/2018    Past Surgical History:  Procedure Laterality Date  . COLONOSCOPY  06/30/2011   Procedure: COLONOSCOPY;  Surgeon: Daneil Dolin, MD;  Location[facility];  Service: Endoscopy;  Laterality: N/A;  7:30 AM       Family History  Problem Relation Age of Onset  . Diabetes Mother   . Hypertension Mother   . Diabetes Sister   . Hypertension Sister   . Diabetes Brother   . Hypertension Brother   . Alzheimer's disease Father     Social History   Tobacco Use  . Smoking status: Current Every  Day Smoker    Packs/day: 0.25    Years: 48.00    Pack years: 12.00    Types: Cigarettes    Last attempt to quit: 02/2018    Years since quitting: 1.7  . Smokeless tobacco: Never Used  Substance Use Topics  . Alcohol use: Not Currently    Alcohol/week: 7.0 standard drinks    Types: 7 Cans of beer per week  . Drug use: Not Currently    Types: Cocaine, Marijuana, Oxycodone    Comment: none since 02/2018.     Home Medications Prior to Admission medications   Medication Sig Start Date End Date Taking? Authorizing Provider  amLODipine (NORVASC) 5 MG tablet Take 1 tablet (5 mg total) by mouth daily. 10/29/19   Doree Albee, MD  atorvastatin (LIPITOR) 20 MG tablet TAKE 1 Tablet BY MOUTH ONCE EVERY DAY 10/04/19   Sharion Balloon, NP  cyclobenzaprine (FLEXERIL) 10 MG tablet Take 1 tablet (10 mg total) by mouth 3 (three) times daily as needed for muscle spasms. 11/11/19   Milton Ferguson, MD  HYDROcodone-acetaminophen (NORCO/VICODIN) 5-325 MG tablet Take 1 tablet by mouth every 6 (six) hours as needed. 11/11/19   Milton Ferguson, MD  lisinopril-hydrochlorothiazide (ZESTORETIC) 20-12.5 MG tablet TAKE 1 Tablet BY MOUTH ONCE EVERY DAY 10/04/19   Sharion Balloon, NP  metFORMIN (GLUCOPHAGE) 1000 MG tablet TAKE 1 Tablet  BY MOUTH TWICE DAILY WITH A MEAL 10/04/19   Barkley Boards  H, NP  predniSONE (DELTASONE) 20 MG tablet Take 2 tablets (40 mg total) by mouth daily with breakfast. 10/29/19   Hurshel Party C, MD  sildenafil (VIAGRA) 100 MG tablet Take 0.5-1 tablets (50-100 mg total) by mouth daily as needed for erectile dysfunction. 03/06/19   Soyla Dryer, PA-C    Allergies    Patient has no known allergies.  Review of Systems   Review of Systems  Constitutional: Negative for appetite change and fatigue.  HENT: Negative for congestion, ear discharge and sinus pressure.   Eyes: Negative for discharge.  Respiratory: Negative for cough.   Cardiovascular: Positive for chest pain.  Gastrointestinal:  Negative for abdominal pain and diarrhea.  Genitourinary: Positive for flank pain. Negative for frequency and hematuria.  Musculoskeletal: Negative for back pain.  Skin: Negative for rash.  Neurological: Negative for seizures and headaches.  Psychiatric/Behavioral: Negative for hallucinations.    Physical Exam Updated Vital Signs BP (!) 154/96   Pulse 60   Temp 98.2 F (36.8 C) (Oral)   Resp 18   Ht 5\' 11"  (1.803 m)   Wt 77.1 kg   SpO2 100%   BMI 23.71 kg/m   Physical Exam Vitals and nursing note reviewed.  Constitutional:      Appearance: He is well-developed.  HENT:     Head: Normocephalic.     Nose: Nose normal.  Eyes:     General: No scleral icterus.    Conjunctiva/sclera: Conjunctivae normal.  Neck:     Thyroid: No thyromegaly.  Cardiovascular:     Rate and Rhythm: Normal rate and regular rhythm.     Heart sounds: No murmur. No friction rub. No gallop.   Pulmonary:     Breath sounds: No stridor. No wheezing or rales.  Chest:     Chest wall: No tenderness.  Abdominal:     General: There is no distension.     Tenderness: There is no abdominal tenderness. There is no rebound.  Musculoskeletal:     Cervical back: Neck supple.     Comments: Tender right flank  Lymphadenopathy:     Cervical: No cervical adenopathy.  Skin:    Findings: No erythema or rash.  Neurological:     Mental Status: He is alert and oriented to person, place, and time.     Motor: No abnormal muscle tone.     Coordination: Coordination normal.  Psychiatric:        Behavior: Behavior normal.     ED Results / Procedures / Treatments   Labs (all labs ordered are listed, but only abnormal results are displayed) Labs Reviewed  CBC WITH DIFFERENTIAL/PLATELET - Abnormal; Notable for the following components:      Result Value   WBC 13.6 (*)    Hemoglobin 12.3 (*)    Neutro Abs 10.6 (*)    All other components within normal limits  COMPREHENSIVE METABOLIC PANEL - Abnormal; Notable for  the following components:   Glucose, Bld 101 (*)    Creatinine, Ser 1.40 (*)    GFR calc non Af Amer 54 (*)    All other components within normal limits  URINE CULTURE  URINALYSIS, ROUTINE W REFLEX MICROSCOPIC    EKG None  Radiology CT Renal Stone Study  Result Date: 11/11/2019 CLINICAL DATA:  Right flank pain x2 days. EXAM: CT ABDOMEN AND PELVIS WITHOUT CONTRAST TECHNIQUE: Multidetector CT imaging of the abdomen and pelvis was performed following the standard protocol without IV contrast. COMPARISON:  None. FINDINGS: Lower chest: No  acute abnormality. Hepatobiliary: No focal liver abnormality is seen. No gallstones, gallbladder wall thickening, or biliary dilatation. Pancreas: Unremarkable. No pancreatic ductal dilatation or surrounding inflammatory changes. Spleen: Normal in size without focal abnormality. Adrenals/Urinary Tract: Adrenal glands are unremarkable. Kidneys are normal, without renal calculi, focal lesion, or hydronephrosis. Mild diffuse urinary bladder wall thickening is noted. Stomach/Bowel: Stomach is within normal limits. Appendix appears normal. No evidence of bowel wall thickening, distention, or inflammatory changes. Vascular/Lymphatic: There is mild to moderate severity calcification of the abdominal aorta and bilateral common iliac arteries. No enlarged abdominal or pelvic lymph nodes. Reproductive: The prostate gland is mildly enlarged. Other: No abdominal wall hernia or abnormality. No abdominopelvic ascites. Musculoskeletal: No acute or significant osseous findings. IMPRESSION: 1. Mild diffuse urinary bladder wall thickening which may represent cystitis. Correlation with urinalysis is recommended. 2. Mild to moderate severity calcification of the abdominal aorta and bilateral common iliac arteries. 3. Mildly enlarged prostate gland. Aortic Atherosclerosis (ICD10-I70.0). Electronically Signed   By: Virgina Norfolk M.D.   On: 11/11/2019 17:55    Procedures Procedures  (including critical care time)  Medications Ordered in ED Medications  HYDROmorphone (DILAUDID) injection 1 mg (1 mg Intravenous Given 11/11/19 1733)  ondansetron (ZOFRAN) injection 4 mg (4 mg Intravenous Given 11/11/19 1732)  LORazepam (ATIVAN) injection 0.5 mg (0.5 mg Intravenous Given 11/11/19 1733)    ED Course  I have reviewed the triage vital signs and the nursing notes.  Pertinent labs & imaging results that were available during my care of the patient were reviewed by me and considered in my medical decision making (see chart for details).    MDM Rules/Calculators/A&P                     CBC urinalysis chemistries and CT abdomen all unremarkable.  Suspect inflamed muscles.  Patient placed on Flexeril and Vicodin and will follow up with PCP      This patient presents to the ED for concern of flank pain this involves an extensive number of treatment options, and is a complaint that carries with it a high risk of complications and morbidity.  The differential diagnosis includes UTI or kidney stone   Lab Tests:   I Ordered, reviewed, and interpreted labs, which included CBC chemistries and urinalysis show white blood count elevated otherwise negative  Medicines ordered:   I ordered medication Dilaudid for pain  Imaging Studies ordered:   I ordered imaging studies which included CT renal and  I independently visualized and interpreted imaging which showed unremarkable  Additional history obtained:   Additional history obtained from records  Previous records obtained and reviewed   Consultations Obtained:   Reevaluation:  After the interventions stated above, I reevaluated the patient and found improved  Critical Interventions:  .   Final Clinical Impression(s) / ED Diagnoses Final diagnoses:  Flank pain    Rx / DC Orders ED Discharge Orders         Ordered    cyclobenzaprine (FLEXERIL) 10 MG tablet  3 times daily PRN     11/11/19 1839     HYDROcodone-acetaminophen (NORCO/VICODIN) 5-325 MG tablet  Every 6 hours PRN     11/11/19 1839           Milton Ferguson, MD 11/11/19 1843

## 2019-11-11 NOTE — Discharge Instructions (Addendum)
Follow-up with your family doctor this week for recheck no heavy lifting

## 2019-11-11 NOTE — ED Triage Notes (Signed)
Patient c/o right flank pain that radiates into right lower abd x2 days. Patient denies any nausea, vomiting, diarrhea, fevers, or urinary symptoms. Denies hx of kidney stones.

## 2019-11-13 LAB — URINE CULTURE: Culture: NO GROWTH

## 2019-12-05 ENCOUNTER — Encounter (INDEPENDENT_AMBULATORY_CARE_PROVIDER_SITE_OTHER): Payer: Self-pay | Admitting: Internal Medicine

## 2019-12-05 ENCOUNTER — Ambulatory Visit (INDEPENDENT_AMBULATORY_CARE_PROVIDER_SITE_OTHER): Payer: Self-pay | Admitting: Internal Medicine

## 2019-12-05 ENCOUNTER — Other Ambulatory Visit: Payer: Self-pay

## 2019-12-05 VITALS — BP 140/85 | HR 107 | Temp 97.1°F | Ht 71.0 in | Wt 167.8 lb

## 2019-12-05 DIAGNOSIS — M109 Gout, unspecified: Secondary | ICD-10-CM

## 2019-12-05 DIAGNOSIS — N1831 Chronic kidney disease, stage 3a: Secondary | ICD-10-CM

## 2019-12-05 DIAGNOSIS — E1100 Type 2 diabetes mellitus with hyperosmolarity without nonketotic hyperglycemic-hyperosmolar coma (NKHHC): Secondary | ICD-10-CM

## 2019-12-05 DIAGNOSIS — I10 Essential (primary) hypertension: Secondary | ICD-10-CM

## 2019-12-05 MED ORDER — DEXAMETHASONE SODIUM PHOSPHATE 4 MG/ML IJ SOLN: 4.0000 mg | Freq: Once | INTRAMUSCULAR | Status: DC

## 2019-12-05 MED ORDER — ALLOPURINOL 100 MG PO TABS: 100.0000 mg | ORAL_TABLET | Freq: Every day | ORAL | 3 refills | Status: DC

## 2019-12-05 MED ORDER — PREDNISONE 20 MG PO TABS: 40.0000 mg | ORAL_TABLET | Freq: Every day | ORAL | 1 refills | Status: DC

## 2019-12-05 NOTE — Addendum Note (Signed)
Addended by: Saintclair Halsted A on: 12/05/2019 01:30 PM   Modules accepted: Orders

## 2019-12-05 NOTE — Progress Notes (Signed)
Metrics: Intervention Frequency ACO  Documented Smoking Status Yearly  Screened one or more times in 24 months  Cessation Counseling or  Active cessation medication Past 24 months  Past 24 months   Guideline developer: UpToDate (See UpToDate for funding source) Date Released: 2014       Wellness Office Visit  Subjective:  Patient ID: David Nicholson, male    DOB: 25-Jul-1960  Age: 59 y.o. MRN: [id number]  CC: This man is complaining of gout in his feet again. HPI  He has diabetes, hypertension.  He went to the emergency room recently with flank pain which appeared to be musculoskeletal in nature.  He seems to hurt all over. Past Medical History:  Diagnosis Date  . Anxiety   . CKD (chronic kidney disease) stage 3, GFR 30-59 ml/min 10/15/2019  . Diabetes mellitus without complication ([place]) 73/71/0626  . GERD (gastroesophageal reflux disease)   . Gout   . HLD (hyperlipidemia) 10/15/2019  . Hypertension   . Substance abuse [facility])    Past Surgical History:  Procedure Laterality Date  . COLONOSCOPY  06/30/2011   Procedure: COLONOSCOPY;  Surgeon: Daneil Dolin, MD;  Location: [facility];  Service: Endoscopy;  Laterality: N/A;  7:30 AM     Family History  Problem Relation Age of Onset  . Diabetes Mother   . Hypertension Mother   . Diabetes Sister   . Hypertension Sister   . Diabetes Brother   . Hypertension Brother   . 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   Social History   Tobacco Use  . Smoking status: Current Every Day Smoker    Packs/day: 0.25    Years: 48.00    Pack years: 12.00    Types: Cigarettes    Last attempt to quit: 02/2018    Years since quitting: 1.8  . Smokeless tobacco: Never Used  Substance Use Topics  . Alcohol use: Not Currently    Alcohol/week: 7.0 standard drinks    Types: 7 Cans of  beer per week    Current Meds  Medication Sig  . amLODipine (NORVASC) 5 MG tablet Take 1 tablet (5 mg total) by mouth daily.  . sildenafil (VIAGRA) 100 MG tablet Take 0.5-1 tablets (50-100 mg total) by mouth daily as needed for erectile dysfunction.     No flowsheet data found.   Objective:   Today's Vitals: BP 140/85 (BP Location: Right Arm, Patient Position: Sitting, Cuff Size: Normal)   Pulse (!) 107   Temp (!) 97.1 F (36.2 C) (Temporal)   Ht 5\' 11"  (1.803 m)   Wt 167 lb 12.8 oz (76.1 kg)   SpO2 98%   BMI 23.40 kg/m  Vitals with BMI 12/05/2019 11/11/2019 11/11/2019  Height 5\' 11"  - -  Weight 167 lbs 13 oz - -  BMI 94.85 - -  Systolic 462 703 500  Diastolic 85 93 96  Pulse 938 77 60     Physical Exam  He appears to be in some pain.  Blood pressure slightly elevated.  He has some swelling in his feet and big toe consistent with gout.     Assessment   1. Acute gout involving toe of right foot, unspecified cause   2. Stage 3a chronic kidney disease   3. Essential hypertension   4. Diabetes mellitus with hyperosmolarity without hyperglycemic hyperosmolar nonketotic  coma ([place])       Tests ordered No orders of the defined types were placed in this encounter.    Plan: 1. He will be given dexamethasone 4 mg intramuscular in the office today and I have sent the prescription for prednisone as well as starting allopurinol every day. 2. I will have him come back in about 6 weeks to see Judson Roch and at that time we should check his renal function and A1c.   Meds ordered this encounter  Medications  . predniSONE (DELTASONE) 20 MG tablet    Sig: Take 2 tablets (40 mg total) by mouth daily with breakfast.    Dispense:  10 tablet    Refill:  1  . allopurinol (ZYLOPRIM) 100 MG tablet    Sig: Take 1 tablet (100 mg total) by mouth daily.    Dispense:  30 tablet    Refill:  3    Karrington Mccravy Luther Parody, MD

## 2019-12-20 ENCOUNTER — Encounter (INDEPENDENT_AMBULATORY_CARE_PROVIDER_SITE_OTHER): Payer: Self-pay | Admitting: Internal Medicine

## 2019-12-20 ENCOUNTER — Ambulatory Visit (INDEPENDENT_AMBULATORY_CARE_PROVIDER_SITE_OTHER): Payer: Self-pay | Admitting: Internal Medicine

## 2019-12-20 ENCOUNTER — Other Ambulatory Visit: Payer: Self-pay

## 2019-12-20 VITALS — BP 160/90 | HR 102 | Temp 97.7°F | Ht 71.0 in | Wt 169.2 lb

## 2019-12-20 DIAGNOSIS — K0500 Acute gingivitis, plaque induced: Secondary | ICD-10-CM

## 2019-12-20 DIAGNOSIS — K089 Disorder of teeth and supporting structures, unspecified: Secondary | ICD-10-CM

## 2019-12-20 MED ORDER — AMOXICILLIN-POT CLAVULANATE 875-125 MG PO TABS
1.0000 | ORAL_TABLET | Freq: Two times a day (BID) | ORAL | 0 refills | Status: DC
Start: 2019-12-20 — End: 2020-01-21

## 2019-12-20 NOTE — Progress Notes (Signed)
Metrics: Intervention Frequency ACO  Documented Smoking Status Yearly  Screened one or more times in 24 months  Cessation Counseling or  Active cessation medication Past 24 months  Past 24 months   Guideline developer: UpToDate (See UpToDate for funding source) Date Released: 2014       Wellness Office Visit  Subjective:  Patient ID: David Nicholson, male    DOB: 04-14-1961  Age: 59 y.o. MRN: [id number]  CC: This man comes in his cute visit with right-sided dental pain and swelling of the right face. HPI  The symptoms started yesterday.  He has had tooth problems in the past and has not seen a dentist for 8 months.  He denies any subjective fever. Past Medical History:  Diagnosis Date  . Anxiety   . CKD (chronic kidney disease) stage 3, GFR 30-59 ml/min 10/15/2019  . Diabetes mellitus without complication ([place]) 42/87/6811  . GERD (gastroesophageal reflux disease)   . Gout   . HLD (hyperlipidemia) 10/15/2019  . Hypertension   . Substance abuse [facility])    Past Surgical History:  Procedure Laterality Date  . COLONOSCOPY  06/30/2011   Procedure: COLONOSCOPY;  Surgeon: Daneil Dolin, MD;  Location: [facility];  Service: Endoscopy;  Laterality: N/A;  7:30 AM     Family History  Problem Relation Age of Onset  . Diabetes Mother   . Hypertension Mother   . Diabetes Sister   . Hypertension Sister   . Diabetes Brother   . Hypertension Brother   . 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   Social History   Tobacco Use  . Smoking status: Current Every Day Smoker    Packs/day: 0.25    Years: 48.00    Pack years: 12.00    Types: Cigarettes    Last attempt to quit: 02/2018    Years since quitting: 1.8  . Smokeless tobacco: Never Used  Substance Use Topics  . Alcohol use: Not Currently    Alcohol/week: 7.0 standard  drinks    Types: 7 Cans of beer per week    Current Meds  Medication Sig  . allopurinol (ZYLOPRIM) 100 MG tablet Take 1 tablet (100 mg total) by mouth daily.  Marland Kitchen amLODipine (NORVASC) 5 MG tablet Take 1 tablet (5 mg total) by mouth daily.  . sildenafil (VIAGRA) 100 MG tablet Take 0.5-1 tablets (50-100 mg total) by mouth daily as needed for erectile dysfunction.   Current Facility-Administered Medications for the 12/20/19 encounter (Office Visit) with Doree Albee, MD  Medication  . dexamethasone (DECADRON) injection 4 mg       No flowsheet data found.   Objective:   Today's Vitals: BP (!) 160/90 (BP Location: Right Arm, Patient Position: Sitting, Cuff Size: Normal)   Pulse (!) 102   Temp 97.7 F (36.5 C) (Temporal)   Ht 5\' 11"  (1.803 m)   Wt 169 lb 3.2 oz (76.7 kg)   SpO2 97%   BMI 23.60 kg/m  Vitals with BMI 12/20/2019 12/05/2019 11/11/2019  Height 5\' 11"  5\' 11"  -  Weight 169 lbs 3 oz 167 lbs 13 oz -  BMI 57.26 20.35 -  Systolic 597 416 384  Diastolic 90 85 93  Pulse 536 107 77     Physical Exam  He looks systemically well and does not appear to be septic.  Blood pressure  elevated because of pain.  Examination of his mouth shows gingival swelling, I cannot obviously see an abscess.     Assessment   1. Gingivitis, acute   2. Dental disease       Tests ordered No orders of the defined types were placed in this encounter.    Plan: 1. I am going to treat him empirically with Augmentin as the antibiotic of choice. 2. I have urged him to seek dental care immediately.   Meds ordered this encounter  Medications  . amoxicillin-clavulanate (AUGMENTIN) 875-125 MG tablet    Sig: Take 1 tablet by mouth 2 (two) times daily.    Dispense:  20 tablet    Refill:  0    Tamiki Kuba Luther Parody, MD

## 2020-01-21 ENCOUNTER — Ambulatory Visit (INDEPENDENT_AMBULATORY_CARE_PROVIDER_SITE_OTHER): Payer: PRIVATE HEALTH INSURANCE | Admitting: Nurse Practitioner

## 2020-01-21 ENCOUNTER — Encounter (INDEPENDENT_AMBULATORY_CARE_PROVIDER_SITE_OTHER): Payer: Self-pay | Admitting: Nurse Practitioner

## 2020-01-21 ENCOUNTER — Other Ambulatory Visit: Payer: Self-pay

## 2020-01-21 VITALS — BP 185/90 | HR 83 | Temp 98.1°F | Ht 71.0 in | Wt 169.6 lb

## 2020-01-21 DIAGNOSIS — N182 Chronic kidney disease, stage 2 (mild): Secondary | ICD-10-CM

## 2020-01-21 DIAGNOSIS — I10 Essential (primary) hypertension: Secondary | ICD-10-CM | POA: Diagnosis not present

## 2020-01-21 DIAGNOSIS — M1A9XX0 Chronic gout, unspecified, without tophus (tophi): Secondary | ICD-10-CM | POA: Diagnosis not present

## 2020-01-21 DIAGNOSIS — E782 Mixed hyperlipidemia: Secondary | ICD-10-CM

## 2020-01-21 DIAGNOSIS — E1122 Type 2 diabetes mellitus with diabetic chronic kidney disease: Secondary | ICD-10-CM

## 2020-01-21 DIAGNOSIS — N1831 Chronic kidney disease, stage 3a: Secondary | ICD-10-CM | POA: Diagnosis not present

## 2020-01-21 NOTE — Progress Notes (Signed)
Subjective:  Patient ID: David Nicholson, male    DOB: 27-Sep-1960  Age: 59 y.o. MRN: [id number]  CC:  Chief Complaint  Patient presents with  . Hypertension  . Follow-up  . Diabetes  . Chronic Kidney Disease  . Hyperlipidemia  . Other    Gout      HPI  This patient arrives today for the above.  He continues on amlodipine 5 mg daily, however he tells me he did not take the medication yet today. Last A1c was 5.8 this was collected approximately 3 months ago.  He is not on any medications to control his type 2 diabetes currently, as he may need significant changes to his lifestyle and diet.  He was on Metformin in the past.  He tells me recently he stopped following the lifestyle recommendations, but has been checking his blood sugar at home and says the sugars usually run around the 120s.  He is not on statin or ACE/ARB. He does have a history of chronic kidney disease last blood work showed his EGFR was greater than 60 and creatinine was improved to 1.4 from 1.77 approximate 1 month before.  I do not see where his urine is been checked for albuminuria. He has a history of hyperlipidemia.  Last lipid panel was collected approximately 3 months ago which showed triglyceride level of 544.  He is not on any medications to control his cholesterol at this time.  He tells me he is fasting today. He also has a history of gout and has been started on allopurinol in the recent past.  He tells me is tolerating his medication well.  He is due to have his uric acid level checked today.  Past Medical History:  Diagnosis Date  . Anxiety   . CKD (chronic kidney disease) stage 3, GFR 30-59 ml/min 10/15/2019  . Diabetes mellitus without complication ([place]) 96/78/9381  . GERD (gastroesophageal reflux disease)   . Gout   . HLD (hyperlipidemia) 10/15/2019  . Hypertension   . Substance abuse ([place])       Family History  Problem Relation Age of Onset  . Diabetes Mother   . Hypertension Mother     . Diabetes Sister   . Hypertension Sister   . Diabetes Brother   . Hypertension Brother   . 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   Social History   Tobacco Use  . Smoking status: Current Every Day Smoker    Packs/day: 0.25    Years: 48.00    Pack years: 12.00    Types: Cigarettes    Last attempt to quit: 02/2018    Years since quitting: 1.9  . Smokeless tobacco: Never Used  Substance Use Topics  . Alcohol use: Not Currently    Alcohol/week: 7.0 standard drinks    Types: 7 Cans of beer per week     Current Meds  Medication Sig  . allopurinol (ZYLOPRIM) 100 MG tablet Take 1 tablet (100 mg total) by mouth daily.  Marland Kitchen amLODipine (NORVASC) 5 MG tablet Take 1 tablet (5 mg total) by mouth daily.  Marland Kitchen amoxicillin-clavulanate (AUGMENTIN) 875-125 MG tablet Take 1 tablet by mouth 2 (two) times daily.  . sildenafil (VIAGRA) 100 MG tablet Take 0.5-1 tablets (50-100 mg total) by mouth daily as needed for erectile dysfunction.    ROS:  Review of Systems  Eyes: Negative for blurred vision.  Respiratory: Negative for shortness of breath.   Cardiovascular: Negative for chest pain.  Neurological: Negative for headaches.     Objective:   Today's Vitals: BP (!) 185/90 (BP Location: Left Arm, Patient Position: Sitting, Cuff Size: Normal)   Pulse 83   Temp 98.1 F (36.7 C) (Temporal)   Ht '5\' 11"'  (1.803 m)   Wt 169 lb 9.6 oz (76.9 kg)   SpO2 98%   BMI 23.65 kg/m  Vitals with BMI 01/21/2020 12/20/2019 12/05/2019  Height '5\' 11"'  '5\' 11"'  '5\' 11"'   Weight 169 lbs 10 oz 169 lbs 3 oz 167 lbs 13 oz  BMI 23.66 96.22 29.79  Systolic 892 119 417  Diastolic 90 90 85  Pulse 83 102 107     Physical Exam Vitals reviewed.  Constitutional:      Appearance: Normal appearance.  HENT:     Head: Normocephalic and atraumatic.  Cardiovascular:      Rate and Rhythm: Normal rate and regular rhythm.  Pulmonary:     Effort: Pulmonary effort is normal.     Breath sounds: Normal breath sounds.  Musculoskeletal:     Cervical back: Neck supple.  Skin:    General: Skin is warm and dry.  Neurological:     Mental Status: He is alert and oriented to person, place, and time.  Psychiatric:        Mood and Affect: Mood normal.        Behavior: Behavior normal.        Thought Content: Thought content normal.        Judgment: Judgment normal.          Assessment and Plan   1. Stage 3a chronic kidney disease   2. Diabetes mellitus with hyperosmolarity without hyperglycemic hyperosmolar nonketotic coma ([place])   3. Essential hypertension   4. Mixed hyperlipidemia   5. Chronic gout without tophus, unspecified cause, unspecified site      Plan: 1.  We will collect CMP and check urine for albuminuria today. 2.  We will check A1c today to determine if he needs to restart any medications.  I encouraged him to continue to adhere to a healthy diet full of whole foods and avoid processed carbohydrates.  We also discussed intermittent fasting and that this can help improve insulin sensitivity so as long as he is tolerating fasting I recommend he continue to do this.  We will need to consider starting him on ACE or ARB, and statin therapy at next office visit. 3.  I told him to go home and take his amlodipine, and he will come back tomorrow for nurses visit to check his blood pressure.  He was also counseled to take his amlodipine prior to coming back for nurses visit tomorrow.  If pressure is better on his amlodipine we will continue that regimen, but blood pressure remains elevated will need to make changes to medications. 4.  As she is fasting today we will check lipid panel again today.  Further recommendations were made based upon those results. 5.  We will check uric acid level today, he will continue on his current dose of allopurinol for  now.   Tests ordered Orders Placed This Encounter  Procedures  . Hemoglobin A1c  . CMP with eGFR([facility])  . Uric Acid  . Lipid Panel  . Microalbumin/Creatinine Ratio, Urine      No orders of the defined types were placed in this encounter.  Patient to follow-up in 1 day for nurses visit to check blood pressure, and then again in 6 weeks for follow-up.  Ailene Ards, NP

## 2020-01-21 NOTE — Patient Instructions (Signed)
[facility] Dietary Recommendations for Weight Loss What to Avoid . Avoid added sugars o Often added sugar can be found in processed foods such as many condiments, dry cereals, cakes, cookies, chips, crisps, crackers, candies, sweetened drinks, etc.  o Read labels and AVOID/DECREASE use of foods with the following in their ingredient list: Sugar, fructose, high fructose corn syrup, sucrose, glucose, maltose, dextrose, molasses, cane sugar, brown sugar, any type of syrup, agave nectar, etc.   . Avoid snacking in between meals . Avoid foods made with flour o If you are going to eat food made with flour, choose those made with whole-grains; and, minimize your consumption as much as is tolerable . Avoid processed foods o These foods are generally stocked in the middle of the grocery store. Focus on shopping on the perimeter of the grocery.  . Avoid Meat  o We recommend following a plant-based diet at [facility]. Thus, we recommend avoiding meat as a general rule. Consider eating beans, legumes, eggs, and/or dairy products for regular protein sources o If you plan on eating meat limit to 4 ounces of meat at a time and choose lean options such as Fish, chicken, turkey. Avoid red meat intake such as pork and/or steak What to Include . Vegetables o GREEN LEAFY VEGETABLES: Kale, spinach, mustard greens, collard greens, cabbage, broccoli, etc. o OTHER: Asparagus, cauliflower, eggplant, carrots, peas, Brussel sprouts, tomatoes, bell peppers, zucchini, beets, cucumbers, etc. . Grains, seeds, and legumes o Beans: kidney beans, black eyed peas, garbanzo beans, black beans, pinto beans, etc. o Whole, unrefined grains: brown rice, barley, bulgur, oatmeal, etc. . Healthy fats  o Avoid highly processed fats such as vegetable oil o Examples of healthy fats: avocado, olives, virgin olive oil, dark chocolate (?72% Cocoa), nuts (peanuts, almonds, walnuts, cashews, pecans, etc.) . None to Low  Intake of Animal Sources of Protein o Meat sources: chicken, turkey, salmon, tuna. Limit to 4 ounces of meat at one time. o Consider limiting dairy sources, but when choosing dairy focus on: PLAIN Greek yogurt, cottage cheese, high-protein milk . Fruit o Choose berries  When to Eat . Intermittent Fasting: o Choosing not to eat for a specific time period, but DO FOCUS ON HYDRATION when fasting o Multiple Techniques: - Time Restricted Eating: eat 3 meals in a day, each meal lasting no more than 60 minutes, no snacks between meals - 16-18 hour fast: fast for 16 to 18 hours up to 7 days a week. Often suggested to start with 2-3 nonconsecutive days per week.  . Remember the time you sleep is counted as fasting.  . Examples of eating schedule: Fast from 7:00pm-11:00am. Eat between 11:00am-7:00pm.  - 24-hour fast: fast for 24 hours up to every other day. Often suggested to start with 1 day per week . Remember the time you sleep is counted as fasting . Examples of eating schedule:  o Eating day: eat 2-3 meals on your eating day. If doing 2 meals, each meal should last no more than 90 minutes. If doing 3 meals, each meal should last no more than 60 minutes. Finish last meal by 7:00pm. o Fasting day: Fast until 7:00pm.  o IF YOU FEEL UNWELL FOR ANY REASON/IN ANY WAY WHEN FASTING, STOP FASTING BY EATING A NUTRITIOUS SNACK OR LIGHT MEAL o ALWAYS FOCUS ON HYDRATION DURING FASTS - Acceptable Hydration sources: water, broths, tea/coffee (black tea/coffee is best but using a small amount of whole-fat dairy products in coffee/tea is acceptable).  -   Poor Hydration Sources: anything with sugar or artificial sweeteners added to it  These recommendations have been developed for patients that are actively receiving medical care from either Dr. [facility] or Juddson Cobern, DNP, NP-C at [facility]. These recommendations are developed for patients with specific medical conditions and are not meant to be  distributed or used by others that are not actively receiving care from either provider listed above at [facility]. It is not appropriate to participate in the above eating plans without proper medical supervision.   Reference: Fung, J. The obesity code. [place]: [place]; 2016.   

## 2020-01-22 ENCOUNTER — Encounter (INDEPENDENT_AMBULATORY_CARE_PROVIDER_SITE_OTHER): Payer: Self-pay

## 2020-01-22 ENCOUNTER — Ambulatory Visit (INDEPENDENT_AMBULATORY_CARE_PROVIDER_SITE_OTHER): Payer: PRIVATE HEALTH INSURANCE

## 2020-01-22 VITALS — BP 165/95

## 2020-01-23 LAB — COMPLETE METABOLIC PANEL WITH GFR
AG Ratio: 1.8 (calc) (ref 1.0–2.5)
ALT: 23 U/L (ref 9–46)
AST: 22 U/L (ref 10–35)
Albumin: 4 g/dL (ref 3.6–5.1)
Alkaline phosphatase (APISO): 77 U/L (ref 35–144)
BUN: 12 mg/dL (ref 7–25)
CO2: 23 mmol/L (ref 20–32)
Calcium: 9.1 mg/dL (ref 8.6–10.3)
Chloride: 104 mmol/L (ref 98–110)
Creat: 1.12 mg/dL (ref 0.70–1.25)
GFR, Est African American: 82 mL/min/{1.73_m2} (ref >=60)
GFR, Est Non African American: 71 mL/min/{1.73_m2} (ref >=60)
Globulin: 2.2 g/dL (calc) (ref 1.9–3.7)
Glucose, Bld: 136 mg/dL — ABNORMAL HIGH (ref 65–99)
Potassium: 4.1 mmol/L (ref 3.5–5.3)
Sodium: 139 mmol/L (ref 135–146)
Total Bilirubin: 0.4 mg/dL (ref 0.2–1.2)
Total Protein: 6.2 g/dL (ref 6.1–8.1)

## 2020-01-23 LAB — URIC ACID: Uric Acid, Serum: 5.5 mg/dL (ref 4.0–8.0)

## 2020-01-23 LAB — MICROALBUMIN / CREATININE URINE RATIO
Creatinine, Urine: 174 mg/dL (ref 20–320)
Microalb Creat Ratio: 812 mcg/mg creat — ABNORMAL HIGH (ref <30)
Microalb, Ur: 141.3 mg/dL

## 2020-01-23 LAB — LIPID PANEL
Cholesterol: 195 mg/dL (ref <200)
HDL: 66 mg/dL (ref >=40)
LDL Cholesterol (Calc): 90 mg/dL (calc)
Non-HDL Cholesterol (Calc): 129 mg/dL (calc) (ref <130)
Total CHOL/HDL Ratio: 3 (calc) (ref <5.0)
Triglycerides: 324 mg/dL — ABNORMAL HIGH (ref <150)

## 2020-01-23 LAB — HEMOGLOBIN A1C
Hgb A1c MFr Bld: 5.8 % of total Hgb — ABNORMAL HIGH (ref <5.7)
Mean Plasma Glucose: 120 (calc)
eAG (mmol/L): 6.6 (calc)

## 2020-02-21 ENCOUNTER — Encounter: Payer: Self-pay | Admitting: Emergency Medicine

## 2020-02-21 ENCOUNTER — Other Ambulatory Visit: Payer: Self-pay

## 2020-02-21 ENCOUNTER — Ambulatory Visit
Admission: EM | Admit: 2020-02-21 | Discharge: 2020-02-21 | Disposition: A | Discharge status: Home / Self Care | Payer: PRIVATE HEALTH INSURANCE | Attending: Emergency Medicine | Admitting: Emergency Medicine

## 2020-02-21 DIAGNOSIS — R52 Pain, unspecified: Secondary | ICD-10-CM | POA: Diagnosis not present

## 2020-02-21 DIAGNOSIS — R509 Fever, unspecified: Secondary | ICD-10-CM | POA: Diagnosis not present

## 2020-02-21 DIAGNOSIS — R5383 Other fatigue: Secondary | ICD-10-CM | POA: Diagnosis not present

## 2020-02-21 MED ORDER — IBUPROFEN 800 MG PO TABS: 800.0000 mg | ORAL_TABLET | Freq: Once | ORAL | Status: DC

## 2020-02-21 MED ORDER — ACETAMINOPHEN 325 MG PO TABS
650.0000 mg | ORAL_TABLET | Freq: Once | ORAL | Status: AC
  Administered 2020-02-21: 650 mg via ORAL

## 2020-02-21 NOTE — ED Triage Notes (Signed)
Reports having chills, fatigue, body aches since getting second covid vaccine last Thursday.

## 2020-02-21 NOTE — ED Provider Notes (Signed)
[place]   [id number] 02/21/20 Arrival Time: 7846   CC: COVID symptoms  SUBJECTIVE: History from: patient.  David Nicholson is a 60 y.o. male who presents to the urgent care for complaint of chills, fever, fatigue, body aches that started this past Thursday.  States she received her second Covid vaccine that same Thursday.  Denies sick exposure to COVID, flu or strep.  Denies recent travel.  Has tried OTC medication without relief.  Denies aggravating factors.  Denies previous symptoms in the past.   Denies fever, chills, fatigue, sinus pain, rhinorrhea, sore throat, SOB, wheezing, chest pain, nausea, changes in bowel or bladder habits.     ROS: As per HPI.  All other pertinent ROS negative.     Past Medical History:  Diagnosis Date  . Anxiety   . CKD (chronic kidney disease) stage 3, GFR 30-59 ml/min 10/15/2019  . Diabetes mellitus without complication ([place]) 96/29/5284  . GERD (gastroesophageal reflux disease)   . Gout   . HLD (hyperlipidemia) 10/15/2019  . Hypertension   . Substance abuse [facility])    Past Surgical History:  Procedure Laterality Date  . COLONOSCOPY  06/30/2011   Procedure: COLONOSCOPY;  Surgeon: Daneil Dolin, MD;  Location: [facility] SU[facility];  Service: Endoscopy;  Laterality: N/A;  7:30 AM   No Known Allergies No current facility-administered medications on file prior to encounter.   Current Outpatient Medications on File Prior to Encounter  Medication Sig Dispense Refill  . allopurinol (ZYLOPRIM) 100 MG tablet Take 1 tablet (100 mg total) by mouth daily. 30 tablet 3  . amLODipine (NORVASC) 5 MG tablet Take 1 tablet (5 mg total) by mouth daily. 30 tablet 3  . sildenafil (VIAGRA) 100 MG tablet Take 0.5-1 tablets (50-100 mg total) by mouth daily as needed for erectile dysfunction. 6 tablet 3   Social History   Socioeconomic History  . Marital status: Married    Spouse name: Not on file  . Number of children: Not on file  . Years of education: Not  on file  . Highest education level: Not on file  Occupational History  . Not on file  Tobacco Use  . Smoking status: Current Every Day Smoker    Packs/day: 0.25    Years: 48.00    Pack years: 12.00    Types: Cigarettes    Last attempt to quit: 02/2018    Years since quitting: 2.0  . Smokeless tobacco: Never Used  Vaping Use  . Vaping Use: Never used  Substance and Sexual Activity  . Alcohol use: Not Currently    Alcohol/week: 7.0 standard drinks    Types: 7 Cans of beer per week  . Drug use: Not Currently    Types: Cocaine, Marijuana, Oxycodone    Comment: none since 02/2018.   Marland Kitchen Sexual activity: Not on file  Other Topics Concern  . 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   Social Determinants of Health   Financial Resource Strain:   . Difficulty of Paying Living Expenses: Not on file  Food Insecurity:   . Worried About Charity fundraiser in the Last Year: Not on file  . Ran Out of Food in the Last Year: Not on file  Transportation Needs:   . Lack of Transportation (Medical): Not on file  . Lack of Transportation (Non-Medical): Not on file  Physical Activity:   .  Days of Exercise per Week: Not on file  . Minutes of Exercise per Session: Not on file  Stress:   . Feeling of Stress : Not on file  Social Connections:   . Frequency of Communication with Friends and Family: Not on file  . Frequency of Social Gatherings with Friends and Family: Not on file  . Attends Religious Services: Not on file  . Active Member of Clubs or Organizations: Not on file  . Attends Archivist Meetings: Not on file  . Marital Status: Not on file  Intimate Partner Violence:   . Fear of Current or Ex-Partner: Not on file  . Emotionally Abused: Not on file  . Physically Abused: Not on file  . Sexually Abused: Not on file   Family History  Problem Relation Age  of Onset  . Diabetes Mother   . Hypertension Mother   . Diabetes Sister   . Hypertension Sister   . Diabetes Brother   . Hypertension Brother   . Alzheimer's disease Father     OBJECTIVE:  Vitals:   02/21/20 1445  BP: (!) 144/80  Pulse: 94  Resp: 19  Temp: (!) 102.6 F (39.2 C)  TempSrc: Oral  SpO2: 97%  Weight: 177 lb (80.3 kg)  Height: 5\' 11"  (1.803 m)     General appearance: alert; appears fatigued, but nontoxic; speaking in full sentences and tolerating own secretions HEENT: NCAT; Ears: EACs clear, TMs pearly gray; Eyes: PERRL.  EOM grossly intact. Sinuses: nontender; Nose: nares patent without rhinorrhea, Throat: oropharynx clear, tonsils non erythematous or enlarged, uvula midline  Neck: supple without LAD Lungs: unlabored respirations, symmetrical air entry; cough: mild; no respiratory distress; CTAB Heart: regular rate and rhythm.  Radial pulses 2+ symmetrical bilaterally Skin: warm and dry Psychological: alert and cooperative; normal mood and affect  LABS:  No results found for this or any previous visit (from the past 24 hour(s)).   ASSESSMENT & PLAN:  1. Body aches   2. Fever, unspecified   3. Other fatigue     Meds ordered this encounter  Medications  . DISCONTD: ibuprofen (ADVIL) tablet 800 mg  . acetaminophen (TYLENOL) tablet 650 mg   Symptom is likely from adverse effects of Moderna Covid vaccine.  He was advised to take Tylenol every 4 hours as needed for fever and pain   Discharge instructions  COVID testing ordered.  It will take between 2-7 days for test results.  Someone will contact you regarding abnormal results.    In the meantime: You should remain isolated in your home for 10 days from symptom onset AND greater than 24 hours after symptoms resolution (absence of fever without the use of fever-reducing medication and improvement in respiratory symptoms), whichever is longer Get plenty of rest and push fluids Use medications daily for  symptom relief Use OTC medications like ibuprofen or tylenol as needed fever or pain Call or go to the ED if you have any new or worsening symptoms such as fever, worsening cough, shortness of breath, chest tightness, chest pain, turning blue, changes in mental status, etc...   Reviewed expectations re: course of current medical issues. Questions answered. Outlined signs and symptoms indicating need for more acute intervention. Patient verbalized understanding. After Visit Summary given.      Note: This document was prepared using Dragon voice recognition software and may include unintentional dictation errors.    Emerson Monte, FNP 02/21/20 1506

## 2020-02-21 NOTE — Discharge Instructions (Signed)

## 2020-02-23 LAB — NOVEL CORONAVIRUS, NAA: SARS-CoV-2, NAA: NOT DETECTED

## 2020-02-23 LAB — SARS-COV-2, NAA 2 DAY TAT

## 2020-02-24 ENCOUNTER — Other Ambulatory Visit: Payer: Self-pay

## 2020-02-24 ENCOUNTER — Emergency Department (HOSPITAL_COMMUNITY)
Admission: EM | Admit: 2020-02-24 | Discharge: 2020-02-24 | Disposition: A | Discharge status: Home / Self Care | Payer: PRIVATE HEALTH INSURANCE | Attending: Emergency Medicine | Admitting: Emergency Medicine

## 2020-02-24 ENCOUNTER — Encounter (HOSPITAL_COMMUNITY): Payer: Self-pay | Admitting: Emergency Medicine

## 2020-02-24 ENCOUNTER — Emergency Department (HOSPITAL_COMMUNITY): Payer: PRIVATE HEALTH INSURANCE

## 2020-02-24 DIAGNOSIS — Z20822 Contact with and (suspected) exposure to covid-19: Secondary | ICD-10-CM | POA: Insufficient documentation

## 2020-02-24 DIAGNOSIS — E1100 Type 2 diabetes mellitus with hyperosmolarity without nonketotic hyperglycemic-hyperosmolar coma (NKHHC): Secondary | ICD-10-CM | POA: Insufficient documentation

## 2020-02-24 DIAGNOSIS — I129 Hypertensive chronic kidney disease with stage 1 through stage 4 chronic kidney disease, or unspecified chronic kidney disease: Secondary | ICD-10-CM | POA: Insufficient documentation

## 2020-02-24 DIAGNOSIS — R918 Other nonspecific abnormal finding of lung field: Secondary | ICD-10-CM

## 2020-02-24 DIAGNOSIS — K219 Gastro-esophageal reflux disease without esophagitis: Secondary | ICD-10-CM | POA: Insufficient documentation

## 2020-02-24 DIAGNOSIS — R531 Weakness: Secondary | ICD-10-CM | POA: Diagnosis present

## 2020-02-24 DIAGNOSIS — N183 Chronic kidney disease, stage 3 unspecified: Secondary | ICD-10-CM | POA: Insufficient documentation

## 2020-02-24 DIAGNOSIS — R10817 Generalized abdominal tenderness: Secondary | ICD-10-CM | POA: Insufficient documentation

## 2020-02-24 DIAGNOSIS — F17210 Nicotine dependence, cigarettes, uncomplicated: Secondary | ICD-10-CM | POA: Insufficient documentation

## 2020-02-24 DIAGNOSIS — Z79899 Other long term (current) drug therapy: Secondary | ICD-10-CM | POA: Diagnosis not present

## 2020-02-24 DIAGNOSIS — J189 Pneumonia, unspecified organism: Secondary | ICD-10-CM

## 2020-02-24 DIAGNOSIS — K5900 Constipation, unspecified: Secondary | ICD-10-CM | POA: Diagnosis not present

## 2020-02-24 DIAGNOSIS — J181 Lobar pneumonia, unspecified organism: Secondary | ICD-10-CM | POA: Diagnosis not present

## 2020-02-24 DIAGNOSIS — R0602 Shortness of breath: Secondary | ICD-10-CM

## 2020-02-24 LAB — COMPREHENSIVE METABOLIC PANEL
ALT: 47 U/L — ABNORMAL HIGH (ref 0–44)
AST: 37 U/L (ref 15–41)
Albumin: 2.7 g/dL — ABNORMAL LOW (ref 3.5–5.0)
Alkaline Phosphatase: 82 U/L (ref 38–126)
Anion gap: 11 (ref 5–15)
BUN: 13 mg/dL (ref 6–20)
CO2: 24 mmol/L (ref 22–32)
Calcium: 8.7 mg/dL — ABNORMAL LOW (ref 8.9–10.3)
Chloride: 97 mmol/L — ABNORMAL LOW (ref 98–111)
Creatinine, Ser: 1.57 mg/dL — ABNORMAL HIGH (ref 0.61–1.24)
GFR calc Af Amer: 55 mL/min — ABNORMAL LOW (ref >60)
GFR calc non Af Amer: 47 mL/min — ABNORMAL LOW (ref >60)
Glucose, Bld: 133 mg/dL — ABNORMAL HIGH (ref 70–99)
Potassium: 3.6 mmol/L (ref 3.5–5.1)
Sodium: 132 mmol/L — ABNORMAL LOW (ref 135–145)
Total Bilirubin: 0.5 mg/dL (ref 0.3–1.2)
Total Protein: 6.8 g/dL (ref 6.5–8.1)

## 2020-02-24 LAB — CBC WITH DIFFERENTIAL/PLATELET
Abs Immature Granulocytes: 0.12 10*3/uL — ABNORMAL HIGH (ref 0.00–0.07)
Basophils Absolute: 0 10*3/uL (ref 0.0–0.1)
Basophils Relative: 0 %
Eosinophils Absolute: 0 10*3/uL (ref 0.0–0.5)
Eosinophils Relative: 0 %
HCT: 35.1 % — ABNORMAL LOW (ref 39.0–52.0)
Hemoglobin: 11.1 g/dL — ABNORMAL LOW (ref 13.0–17.0)
Immature Granulocytes: 1 %
Lymphocytes Relative: 6 %
Lymphs Abs: 0.6 10*3/uL — ABNORMAL LOW (ref 0.7–4.0)
MCH: 26.1 pg (ref 26.0–34.0)
MCHC: 31.6 g/dL (ref 30.0–36.0)
MCV: 82.4 fL (ref 80.0–100.0)
Monocytes Absolute: 1 10*3/uL (ref 0.1–1.0)
Monocytes Relative: 10 %
Neutro Abs: 7.9 10*3/uL — ABNORMAL HIGH (ref 1.7–7.7)
Neutrophils Relative %: 83 %
Platelets: 474 10*3/uL — ABNORMAL HIGH (ref 150–400)
RBC: 4.26 MIL/uL (ref 4.22–5.81)
RDW: 16.4 % — ABNORMAL HIGH (ref 11.5–15.5)
WBC: 9.6 10*3/uL (ref 4.0–10.5)
nRBC: 0 % (ref 0.0–0.2)

## 2020-02-24 LAB — TROPONIN I (HIGH SENSITIVITY): Troponin I (High Sensitivity): 11 ng/L (ref <18)

## 2020-02-24 LAB — SARS CORONAVIRUS 2 BY RT PCR (HOSPITAL ORDER, PERFORMED IN ~~LOC~~ HOSPITAL LAB): SARS Coronavirus 2: NEGATIVE

## 2020-02-24 MED ORDER — AMOXICILLIN-POT CLAVULANATE ER 1000-62.5 MG PO TB12: 1.0000 | ORAL_TABLET | Freq: Two times a day (BID) | ORAL | 0 refills | Status: AC

## 2020-02-24 MED ORDER — SODIUM CHLORIDE 0.9 % IV SOLN
2.0000 g | Freq: Once | INTRAVENOUS | Status: AC
  Administered 2020-02-24: 2 g via INTRAVENOUS
  Filled 2020-02-24: qty 20

## 2020-02-24 MED ORDER — ACETAMINOPHEN 500 MG PO TABS
1000.0000 mg | ORAL_TABLET | Freq: Once | ORAL | Status: AC
  Administered 2020-02-24: 1000 mg via ORAL
  Filled 2020-02-24: qty 2

## 2020-02-24 MED ORDER — AZITHROMYCIN 250 MG PO TABS: 250.0000 mg | ORAL_TABLET | Freq: Every day | ORAL | 0 refills | Status: AC

## 2020-02-24 MED ORDER — SODIUM CHLORIDE 0.9 % IV BOLUS
1000.0000 mL | Freq: Once | INTRAVENOUS | Status: AC
  Administered 2020-02-24: 1000 mL via INTRAVENOUS

## 2020-02-24 MED ORDER — AZITHROMYCIN 250 MG PO TABS
500.0000 mg | ORAL_TABLET | Freq: Once | ORAL | Status: AC
  Administered 2020-02-24: 500 mg via ORAL
  Filled 2020-02-24: qty 2

## 2020-02-24 NOTE — Discharge Instructions (Signed)
Great to meet you today! Your chest xray showed a pneumonia. Your covid test was negative. We gave you iv fluids and antibiotics in the hospital.  Please take Augmentin and Azithromycin for 1 week after discharge. Please follow up with your PCP in the next 2 days for a repeat kidney function test.   If you develop worsening breathing issues, chest pains, fevers etc then please come back to the ER immediately.

## 2020-02-24 NOTE — ED Provider Notes (Signed)
[facility] EMERGENCY DEPARTMENT Provider Note   CSN: [id number] Arrival date & time: 02/24/20  3354     History Chief Complaint  Patient presents with  . Generalized Body Aches    David Nicholson is a 59 y.o. male.  David Nicholson is a 59 year old male with a past medical history of anxiety, CKD, diabetes, GERD, gout, hyperlipidemia, hypertension and substance abuse presents to the ER today with a 3-day history of generalized weakness, myalgias, headaches and night sweats.  Patient received his second Covid vaccine Omelia Blackwater) which was 4 days ago.  He started feeling unwell today after.  Associated symptoms include sore throat, dry cough, chest pain when coughing, generalized abdominal pain, dizziness, arthralgia, hiccups, shortness of breath on exertion.  Has taken Tylenol over the weekend which has not helped his symptoms.  Denies sick contacts or Covid exposures. Denies rectal bleeding, melena or urinary symptoms. Sexually active with his wife of 20 years. Denies other sexual partners.        Past Medical History:  Diagnosis Date  . Anxiety   . CKD (chronic kidney disease) stage 3, GFR 30-59 ml/min 10/15/2019  . Diabetes mellitus without complication ([place]) 56/25/6389  . GERD (gastroesophageal reflux disease)   . Gout   . HLD (hyperlipidemia) 10/15/2019  . Hypertension   . Substance abuse [facility])     Patient Active Problem List   Diagnosis Date Noted  . CKD (chronic kidney disease) stage 3, GFR 30-59 ml/min 10/15/2019  . HLD (hyperlipidemia) 10/15/2019  . Diabetes mellitus with hyperosmolarity without hyperglycemic hyperosmolar nonketotic coma ([place]) 07/25/2018  . Hypertension 07/25/2018    Past Surgical History:  Procedure Laterality Date  . COLONOSCOPY  06/30/2011   Procedure: COLONOSCOPY;  Surgeon: Daneil Dolin, MD;  Location: [facility];  Service: Endoscopy;  Laterality: N/A;  7:30 AM       Family History  Problem Relation Age of Onset  . Diabetes Mother     . Hypertension Mother   . Diabetes Sister   . Hypertension Sister   . Diabetes Brother   . Hypertension Brother   . Alzheimer's disease Father     Social History   Tobacco Use  . Smoking status: Current Every Day Smoker    Packs/day: 0.25    Years: 48.00    Pack years: 12.00    Types: Cigarettes    Last attempt to quit: 02/2018    Years since quitting: 2.0  . Smokeless tobacco: Never Used  Vaping Use  . Vaping Use: Never used  Substance Use Topics  . Alcohol use: Not Currently    Alcohol/week: 7.0 standard drinks    Types: 7 Cans of beer per week  . Drug use: Not Currently    Types: Cocaine, Marijuana, Oxycodone    Comment: none since 02/2018.     Home Medications Prior to Admission medications   Medication Sig Start Date End Date Taking? Authorizing Provider  allopurinol (ZYLOPRIM) 100 MG tablet Take 1 tablet (100 mg total) by mouth daily. 12/05/19   Hurshel Party C, MD  amLODipine (NORVASC) 5 MG tablet Take 1 tablet (5 mg total) by mouth daily. 10/29/19   Doree Albee, MD  amoxicillin-clavulanate (AUGMENTIN XR) 1000-62.5 MG 12 hr tablet Take 1 tablet by mouth 2 (two) times daily for 7 days. 02/24/20 03/02/20  Lattie Haw, MD  azithromycin (ZITHROMAX) 250 MG tablet Take 1 tablet (250 mg total) by mouth daily for 6 days. 02/24/20 03/01/20  Lattie Haw, MD  sildenafil (VIAGRA) 100  MG tablet Take 0.5-1 tablets (50-100 mg total) by mouth daily as needed for erectile dysfunction. 03/06/19   Soyla Dryer, PA-C    Allergies    Patient has no known allergies.  Review of Systems   Review of Systems  Constitutional: Positive for appetite change, chills and fever.  HENT: Negative for congestion and rhinorrhea.   Respiratory: Positive for cough, chest tightness and shortness of breath.   Cardiovascular: Positive for chest pain.  Gastrointestinal: Positive for abdominal pain and constipation. Negative for anal bleeding, blood in stool, diarrhea and vomiting.   Genitourinary: Negative for dysuria, frequency and hematuria.  Musculoskeletal: Positive for arthralgias and back pain.  Neurological: Positive for dizziness and headaches.    Physical Exam Updated Vital Signs BP 128/81 (BP Location: Left Arm)   Pulse 75   Temp 99.1 F (37.3 C) (Oral)   Resp 18   Ht 5\' 11"  (1.803 m)   Wt 80.3 kg   SpO2 96%   BMI 24.69 kg/m   Physical Exam Constitutional:      Appearance: Normal appearance. He is normal weight. He is not ill-appearing.  HENT:     Head: Normocephalic and atraumatic.     Mouth/Throat:     Mouth: Mucous membranes are dry.  Eyes:     Extraocular Movements: Extraocular movements intact.  Cardiovascular:     Rate and Rhythm: Normal rate and regular rhythm.     Pulses: Normal pulses.     Heart sounds: Normal heart sounds, S1 normal and S2 normal.  Pulmonary:    Abdominal:     General: Abdomen is flat.     Palpations: Abdomen is soft.     Tenderness: There is abdominal tenderness (generalised ). There is no right CVA tenderness or left CVA tenderness.  Musculoskeletal:     Cervical back: Normal range of motion and neck supple.  Skin:    General: Skin is warm and dry.  Neurological:     General: No focal deficit present.     Mental Status: He is alert.     ED Results / Procedures / Treatments   Labs (all labs ordered are listed, but only abnormal results are displayed) Labs Reviewed  CBC WITH DIFFERENTIAL/PLATELET - Abnormal; Notable for the following components:      Result Value   Hemoglobin 11.1 (*)    HCT 35.1 (*)    RDW 16.4 (*)    Platelets 474 (*)    Neutro Abs 7.9 (*)    Lymphs Abs 0.6 (*)    Abs Immature Granulocytes 0.12 (*)    All other components within normal limits  COMPREHENSIVE METABOLIC PANEL - Abnormal; Notable for the following components:   Sodium 132 (*)    Chloride 97 (*)    Glucose, Bld 133 (*)    Creatinine, Ser 1.57 (*)    Calcium 8.7 (*)    Albumin 2.7 (*)    ALT 47 (*)    GFR  calc non Af Amer 47 (*)    GFR calc Af Amer 55 (*)    All other components within normal limits  SARS CORONAVIRUS 2 BY RT PCR (HOSPITAL ORDER, [facility])  TROPONIN I (HIGH SENSITIVITY)    EKG None  Radiology DG Chest Port 1 View  Result Date: 02/24/2020 CLINICAL DATA:  Chills, myalgias, headache, generalized weakness EXAM: PORTABLE CHEST 1 VIEW COMPARISON:  05/24/2011 chest radiograph. FINDINGS: Stable cardiomediastinal silhouette with normal heart size. No pneumothorax. No pleural effusion. Patchy  consolidation at the right lung base. Faint patchy left lung base opacity. These findings are new. IMPRESSION: New patchy bibasilar lung opacities, right greater than left, suspicious for multilobar pneumonia. Chest radiograph follow-up advised. Electronically Signed   By: Ilona Sorrel M.D.   On: 02/24/2020 11:20    Procedures Procedures (including critical care time)  Medications Ordered in ED Medications  acetaminophen (TYLENOL) tablet 1,000 mg (1,000 mg Oral Given 02/24/20 1145)  sodium chloride 0.9 % bolus 1,000 mL (1,000 mLs Intravenous New Bag/Given 02/24/20 1256)  cefTRIAXone (ROCEPHIN) 2 g in sodium chloride 0.9 % 100 mL IVPB (0 g Intravenous Stopped 02/24/20 1423)  azithromycin (ZITHROMAX) tablet 500 mg (500 mg Oral Given 02/24/20 1305)    ED Course  I have reviewed the triage vital signs and the nursing notes.  Pertinent labs & imaging results that were available during my care of the patient were reviewed by me and considered in my medical decision making (see chart for details).    MDM Rules/Calculators/A&P                          Zedric Deroy is a 59 year old male with a past medical history of anxiety, CKD, diabetes, GERD, gout, hyperlipidemia, hypertension and substance abuse presents to the ER today with a 3-day history of generalized weakness, myalgias, headaches and night sweats. CXR: opacities R>L indicative of multilobar PNA.  Labs: Na  132, K 3.6, Cr 1.57, Glucose 133, Ca 8.7, ALT 47, Trop 11, WBC 9.6 with neutrophils 7.9 and Hb 11.1. COVID negative. EKG: t wave inversion in inferolateral leads. Vital signs: T 100.8, BP 130/84, HR 84, RR 18, Sats 100% on air. On exam: right basal crackles >left, mild dyspnea. Dx is likely community acquired multilobar PNA with AKI.   13:00: Pt received 1L fluid bolus, iV 2g Ceftriaxone, Azithromycin 500mg  PO. Discussed option of admission Vs outpatient management of PNA. I explained that he could be admitted but he may not get a bed for several hours and there are several COVID cases in the hospital. Pt opted to go home with oral antibiotics and close PCP follow up.  14:40 Reviewed pt post antibiotics and fluid bolus. He is feeling much better. Discharged with 1 week course of 2g Augmentin BID and Azithromycin 250mg  daily. Strongly recommended follow up with PCP for CMP in the next 2 days to monitor creatinine. Pt expressed understanding and is happy with the plan. He has asked his wife to collect him from the ER rather than driving home.    Final Clinical Impression(s) / ED Diagnoses Final diagnoses:  Multilobar lung infiltrate  Community acquired pneumonia, unspecified laterality    Rx / DC Orders ED Discharge Orders         Ordered    azithromycin (ZITHROMAX) 250 MG tablet  Daily        02/24/20 1346    amoxicillin-clavulanate (AUGMENTIN XR) 1000-62.5 MG 12 hr tablet  2 times daily        02/24/20 1346           Lattie Haw, MD 02/24/20 1440    Elnora Morrison, MD 02/24/20 1503

## 2020-02-24 NOTE — ED Triage Notes (Addendum)
Pt reports chills, body aches, headache, generalized weakness x1 week. Pt reports seen for same and reports was told to take tylenol. Pt denies any improvement in pain. Pt reports received second covid vaccine 02/14/20. Pt reports was tested for covid at urgent care and was negative.

## 2020-02-25 ENCOUNTER — Telehealth (INDEPENDENT_AMBULATORY_CARE_PROVIDER_SITE_OTHER): Payer: Self-pay | Admitting: Nurse Practitioner

## 2020-02-25 DIAGNOSIS — N1831 Chronic kidney disease, stage 3a: Secondary | ICD-10-CM

## 2020-02-25 NOTE — Telephone Encounter (Signed)
Please call patient let him know that I saw he was in the emergency department and his physician recommended that we repeat blood work within the next couple of days.  Please see if you get him scheduled for a lab visit this week and still keep his face-to-face follow-up with me next week.  Thank you.

## 2020-02-27 ENCOUNTER — Encounter (INDEPENDENT_AMBULATORY_CARE_PROVIDER_SITE_OTHER): Payer: Self-pay | Admitting: Internal Medicine

## 2020-02-27 ENCOUNTER — Other Ambulatory Visit: Payer: Self-pay

## 2020-02-27 ENCOUNTER — Ambulatory Visit (INDEPENDENT_AMBULATORY_CARE_PROVIDER_SITE_OTHER): Payer: PRIVATE HEALTH INSURANCE | Admitting: Internal Medicine

## 2020-02-27 VITALS — BP 146/74 | HR 90 | Temp 97.6°F | Resp 18 | Ht 71.0 in | Wt 169.4 lb

## 2020-02-27 DIAGNOSIS — I10 Essential (primary) hypertension: Secondary | ICD-10-CM

## 2020-02-27 DIAGNOSIS — R6882 Decreased libido: Secondary | ICD-10-CM

## 2020-02-27 DIAGNOSIS — N1831 Chronic kidney disease, stage 3a: Secondary | ICD-10-CM | POA: Diagnosis not present

## 2020-02-27 DIAGNOSIS — J189 Pneumonia, unspecified organism: Secondary | ICD-10-CM | POA: Diagnosis not present

## 2020-02-27 MED ORDER — AMLODIPINE BESYLATE 10 MG PO TABS: 10.0000 mg | ORAL_TABLET | Freq: Every day | ORAL | 1 refills | Status: DC

## 2020-02-27 NOTE — Progress Notes (Signed)
Metrics: Intervention Frequency ACO  Documented Smoking Status Yearly  Screened one or more times in 24 months  Cessation Counseling or  Active cessation medication Past 24 months  Past 24 months   Guideline developer: UpToDate (See UpToDate for funding source) Date Released: 2014       Wellness Office Visit  Subjective:  Patient ID: David Nicholson, male    DOB: 1960-12-20  Age: 59 y.o. MRN: [id number]  CC: This man comes in to follow-up after emergency room visit.  Main complaint was productive cough and bilateral pneumonia. HPI  This patient became unwell approximately 2 to 3 days after he took a second dose of COVID-19 vaccination.  He started to get body aches, cough productive of purulent sputum and more short of breath.  He eventually went to the emergency room on 02/24/2020 and a chest x-ray showed bilateral multi lobar pneumonia.  He was treated with with Augmentin and Zithromax orally.  He was not hospitalized.  He was dehydrated on lab work.  He now comes in for follow-up. He says he feels somewhat better and productive cough has improved. His COVID-19 test in the emergency room was negative. He also is concerned about the possibility of getting onto disability.  He says that every time he starts a new job, he gets sick fairly quickly. He also describes decreased libido.  In the past, he apparently was receiving testosterone therapy.  He does have erectile dysfunction.  He takes Viagra for this when needed. Past Medical History:  Diagnosis Date  . Anxiety   . CKD (chronic kidney disease) stage 3, GFR 30-59 ml/min 10/15/2019  . Diabetes mellitus without complication ([place]) 02/27/3006  . GERD (gastroesophageal reflux disease)   . Gout   . HLD (hyperlipidemia) 10/15/2019  . Hypertension   . Substance abuse [facility])    Past Surgical History:  Procedure Laterality Date  . COLONOSCOPY  06/30/2011   Procedure: COLONOSCOPY;  Surgeon: Daneil Dolin, MD;  Location: [facility];   Service: Endoscopy;  Laterality: N/A;  7:30 AM     Family History  Problem Relation Age of Onset  . Diabetes Mother   . Hypertension Mother   . Diabetes Sister   . Hypertension Sister   . Diabetes Brother   . Hypertension Brother   . 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   Social History   Tobacco Use  . Smoking status: Current Every Day Smoker    Packs/day: 0.25    Years: 48.00    Pack years: 12.00    Types: Cigarettes    Last attempt to quit: 02/2018    Years since quitting: 2.0  . Smokeless tobacco: Never Used  Substance Use Topics  . Alcohol use: Not Currently    Alcohol/week: 7.0 standard drinks    Types: 7 Cans of beer per week    Current Meds  Medication Sig  . allopurinol (ZYLOPRIM) 100 MG tablet Take 1 tablet (100 mg total) by mouth daily.  Marland Kitchen amLODipine (NORVASC) 5 MG tablet Take 1 tablet (5 mg total) by mouth daily.  [place] Kitchen amoxicillin-clavulanate (AUGMENTIN XR) 1000-62.5 MG 12 hr tablet Take 1 tablet by mouth 2 (two) times daily for 7 days.  Mar[place] Kitchen azithromycin (ZITHROMAX) 250 MG tablet Take 1 tablet (250 mg total) by mouth daily for 6 days.  . sildenafil (VIAGRA) 100 MG  tablet Take 0.5-1 tablets (50-100 mg total) by mouth daily as needed for erectile dysfunction.      Depression screen [facility] 2/9 02/27/2020  Decreased Interest 3  Down, Depressed, Hopeless 3  PHQ - 2 Score 6  Altered sleeping 3  Tired, decreased energy 3  Change in appetite 3  Feeling bad or failure about yourself  2  Trouble concentrating 3  Moving slowly or fidgety/restless 3  Suicidal thoughts 0  PHQ-9 Score 23  Difficult doing work/chores Very difficult     Objective:   Today's Vitals: BP (!) 146/74 (BP Location: Left Arm, Patient Position: Sitting, Cuff Size: Normal)   Pulse 90   Temp 97.6 F (36.4 C) (Temporal)   Resp 18    Ht 5\' 11"  (1.803 m)   Wt 169 lb 6.4 oz (76.8 kg) Comment: with steel toe boots on.  SpO2 98%   BMI 23.63 kg/m  Vitals with BMI 02/27/2020 02/24/2020 02/24/2020  Height 5\' 11"  - 5\' 11"   Weight 169 lbs 6 oz - 177 lbs  BMI 53.74 - 82.7  Systolic 078 675 -  Diastolic 74 81 -  Pulse 90 75 -     Physical Exam  He looks systemically well.  Oxygen saturation 98% on room air.  Lung fields are clear on exam.  There are no crackles, wheezing or bronchial breathing.     Assessment   1. Essential hypertension   2. Stage 3a chronic kidney disease   3. Pneumonia of both lungs due to infectious organism, unspecified part of lung   4. Decreased libido       Tests ordered Orders Placed This Encounter  Procedures  . CBC  . COMPLETE METABOLIC PANEL WITH GFR  . Testosterone Total,Free,Bio, Males     Plan: 1. We will increase the dose of amlodipine as his blood pressure is not well controlled and I have sent a new prescription for amlodipine 10 mg daily. 2. He will finish all the oral antibiotics for his pneumonia. 3. We will check blood work today including testosterone levels.  I suspect he may well benefit from testosterone therapy. 4. Follow-up in 4 weeks.   Meds ordered this encounter  Medications  . amLODipine (NORVASC) 10 MG tablet    Sig: Take 1 tablet (10 mg total) by mouth daily.    Dispense:  90 tablet    Refill:  1    Suzanna Zahn Luther Parody, MD

## 2020-02-29 LAB — COMPLETE METABOLIC PANEL WITH GFR
AG Ratio: 1.1 (calc) (ref 1.0–2.5)
ALT: 49 U/L — ABNORMAL HIGH (ref 9–46)
AST: 29 U/L (ref 10–35)
Albumin: 3.2 g/dL — ABNORMAL LOW (ref 3.6–5.1)
Alkaline phosphatase (APISO): 98 U/L (ref 35–144)
BUN: 11 mg/dL (ref 7–25)
CO2: 25 mmol/L (ref 20–32)
Calcium: 9 mg/dL (ref 8.6–10.3)
Chloride: 99 mmol/L (ref 98–110)
Creat: 1.23 mg/dL (ref 0.70–1.25)
GFR, Est African American: 73 mL/min/{1.73_m2} (ref >=60)
GFR, Est Non African American: 63 mL/min/{1.73_m2} (ref >=60)
Globulin: 3 g/dL (calc) (ref 1.9–3.7)
Glucose, Bld: 120 mg/dL — ABNORMAL HIGH (ref 65–99)
Potassium: 4.3 mmol/L (ref 3.5–5.3)
Sodium: 137 mmol/L (ref 135–146)
Total Bilirubin: 0.2 mg/dL (ref 0.2–1.2)
Total Protein: 6.2 g/dL (ref 6.1–8.1)

## 2020-02-29 LAB — CBC
HCT: 38 % — ABNORMAL LOW (ref 38.5–50.0)
Hemoglobin: 11.9 g/dL — ABNORMAL LOW (ref 13.2–17.1)
MCH: 26.1 pg — ABNORMAL LOW (ref 27.0–33.0)
MCHC: 31.3 g/dL — ABNORMAL LOW (ref 32.0–36.0)
MCV: 83.3 fL (ref 80.0–100.0)
MPV: 9.9 fL (ref 7.5–12.5)
Platelets: 583 10*3/uL — ABNORMAL HIGH (ref 140–400)
RBC: 4.56 10*6/uL (ref 4.20–5.80)
RDW: 16.2 % — ABNORMAL HIGH (ref 11.0–15.0)
WBC: 7.6 10*3/uL (ref 3.8–10.8)

## 2020-02-29 LAB — TESTOSTERONE TOTAL,FREE,BIO, MALES
Albumin: 3.2 g/dL — ABNORMAL LOW (ref 3.6–5.1)
Sex Hormone Binding: 25 nmol/L (ref 22–77)
Testosterone, Bioavailable: 72.9 ng/dL — ABNORMAL LOW (ref >=110.0)
Testosterone, Free: 48.9 pg/mL (ref 46.0–224.0)
Testosterone: 282 ng/dL (ref 250–827)

## 2020-03-03 ENCOUNTER — Ambulatory Visit (INDEPENDENT_AMBULATORY_CARE_PROVIDER_SITE_OTHER): Payer: PRIVATE HEALTH INSURANCE | Admitting: Nurse Practitioner

## 2020-04-09 ENCOUNTER — Ambulatory Visit (INDEPENDENT_AMBULATORY_CARE_PROVIDER_SITE_OTHER): Payer: PRIVATE HEALTH INSURANCE | Admitting: Internal Medicine

## 2020-05-14 ENCOUNTER — Ambulatory Visit (INDEPENDENT_AMBULATORY_CARE_PROVIDER_SITE_OTHER): Payer: PRIVATE HEALTH INSURANCE | Admitting: Internal Medicine

## 2020-06-16 ENCOUNTER — Other Ambulatory Visit (INDEPENDENT_AMBULATORY_CARE_PROVIDER_SITE_OTHER): Payer: Self-pay | Admitting: Internal Medicine

## 2020-06-16 ENCOUNTER — Other Ambulatory Visit: Payer: Self-pay

## 2020-06-16 ENCOUNTER — Encounter (INDEPENDENT_AMBULATORY_CARE_PROVIDER_SITE_OTHER): Payer: Self-pay | Admitting: Internal Medicine

## 2020-06-16 ENCOUNTER — Ambulatory Visit (INDEPENDENT_AMBULATORY_CARE_PROVIDER_SITE_OTHER): Payer: PRIVATE HEALTH INSURANCE | Admitting: Internal Medicine

## 2020-06-16 VITALS — BP 158/90 | HR 97 | Temp 98.4°F | Ht 71.0 in | Wt 171.6 lb

## 2020-06-16 DIAGNOSIS — N3001 Acute cystitis with hematuria: Secondary | ICD-10-CM

## 2020-06-16 DIAGNOSIS — I10 Essential (primary) hypertension: Secondary | ICD-10-CM | POA: Diagnosis not present

## 2020-06-16 MED ORDER — SULFAMETHOXAZOLE-TRIMETHOPRIM 800-160 MG PO TABS: 1.0000 | ORAL_TABLET | Freq: Two times a day (BID) | ORAL | 0 refills | Status: DC

## 2020-06-16 MED ORDER — AMLODIPINE BESYLATE 10 MG PO TABS
10.0000 mg | ORAL_TABLET | Freq: Every day | ORAL | 1 refills | Status: DC
Start: 2020-06-16 — End: 2020-09-17

## 2020-06-16 NOTE — Progress Notes (Signed)
Metrics: Intervention Frequency ACO  Documented Smoking Status Yearly  Screened one or more times in 24 months  Cessation Counseling or  Active cessation medication Past 24 months  Past 24 months   Guideline developer: UpToDate (See UpToDate for funding source) Date Released: 2014       Wellness Office Visit  Subjective:  Patient ID: David Nicholson, male    DOB: August 10, 1960  Age: 60 y.o. MRN: [id number]  CC: Hematuria, dysuria. HPI  This man comes to the office with a 3-day history of increased frequency of micturition, dysuria, hematuria associated with a micturition and lower abdominal discomfort.  He denies any fever, nausea or vomiting. He has a history of hypertension and takes medications although he denied taking his medication this morning. Past Medical History:  Diagnosis Date  . Anxiety   . CKD (chronic kidney disease) stage 3, GFR 30-59 ml/min ([facility]) 10/15/2019  . Diabetes mellitus without complication ([place]) 27/78/2423  . GERD (gastroesophageal reflux disease)   . Gout   . HLD (hyperlipidemia) 10/15/2019  . Hypertension   . Substance abuse [facility])    Past Surgical History:  Procedure Laterality Date  . COLONOSCOPY  06/30/2011   Procedure: COLONOSCOPY;  Surgeon: Daneil Dolin, MD;  Location[facility];  Service: Endoscopy;  Laterality: N/A;  7:30 AM     Family History  Problem Relation Age of Onset  . Diabetes Mother   . Hypertension Mother   . Diabetes Sister   . Hypertension Sister   . Diabetes Brother   . Hypertension Brother   . 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   Social History   Tobacco Use  . Smoking status: Current Every Day Smoker    Packs/day: 0.25    Years: 48.00    Pack years: 12.00    Types: Cigarettes    Last attempt to quit: 02/2018    Years since quitting: 2.3  .  Smokeless tobacco: Never Used  Substance Use Topics  . Alcohol use: Not Currently    Alcohol/week: 7.0 standard drinks    Types: 7 Cans of beer per week    Current Meds  Medication Sig  . allopurinol (ZYLOPRIM) 100 MG tablet Take 1 tablet (100 mg total) by mouth daily.  . sildenafil (VIAGRA) 100 MG tablet Take 0.5-1 tablets (50-100 mg total) by mouth daily as needed for erectile dysfunction.  . sulfamethoxazole-trimethoprim (BACTRIM DS) 800-160 MG tablet Take 1 tablet by mouth 2 (two) times daily.  . [DISCONTINUED] amLODipine (NORVASC) 5 MG tablet Take 1 tablet (5 mg total) by mouth daily.      Depression screen [facility] 2/9 02/27/2020  Decreased Interest 3  Down, Depressed, Hopeless 3  PHQ - 2 Score 6  Altered sleeping 3  Tired, decreased energy 3  Change in appetite 3  Feeling bad or failure about yourself  2  Trouble concentrating 3  Moving slowly or fidgety/restless 3  Suicidal thoughts 0  PHQ-9 Score 23  Difficult doing work/chores Very difficult     Objective:   Today's Vitals: BP (!) 158/90   Pulse 97   Temp 98.4 F (36.9 C) (Temporal)   Ht 5\' 11"  (1.803 m)   Wt 171 lb 9.6 oz (77.8 kg)   SpO2 98%   BMI 23.93 kg/m  Vitals with BMI 06/16/2020 02/27/2020 02/24/2020  Height 5\' 11"  5'  11" -  Weight 171 lbs 10 oz 169 lbs 6 oz -  BMI 93.90 30.09 -  Systolic 233 007 622  Diastolic 90 74 81  Pulse 97 90 75     Physical Exam   He appears well and is not septic or toxic.  Blood pressure elevated but he is in discomfort.    Assessment   1. Acute cystitis with hematuria   2. Essential hypertension       Tests ordered Orders Placed This Encounter  Procedures  . Urinalysis w microscopic + reflex cultur     Plan: 1. We will treat him for probable urinary tract infection.  I will send the urine for urinalysis and culture.  I will treat him empirically with Bactrim and then await culture results.  If the urine does not show UTI, he will obviously need further  evaluation by urology. 2. He will continue with amlodipine 10 mg daily and I have encouraged him to make sure he takes the medication every day. 3. Follow-up with Judson Roch in about 3 months.   Meds ordered this encounter  Medications  . sulfamethoxazole-trimethoprim (BACTRIM DS) 800-160 MG tablet    Sig: Take 1 tablet by mouth 2 (two) times daily.    Dispense:  10 tablet    Refill:  0  . amLODipine (NORVASC) 10 MG tablet    Sig: Take 1 tablet (10 mg total) by mouth daily.    Dispense:  90 tablet    Refill:  1    Denilson Salminen Luther Parody, MD

## 2020-06-19 ENCOUNTER — Ambulatory Visit (INDEPENDENT_AMBULATORY_CARE_PROVIDER_SITE_OTHER): Payer: PRIVATE HEALTH INSURANCE | Admitting: Internal Medicine

## 2020-06-19 ENCOUNTER — Other Ambulatory Visit (INDEPENDENT_AMBULATORY_CARE_PROVIDER_SITE_OTHER): Payer: Self-pay | Admitting: Internal Medicine

## 2020-06-19 LAB — URINALYSIS W MICROSCOPIC + REFLEX CULTURE
Bilirubin Urine: NEGATIVE
Glucose, UA: NEGATIVE
Ketones, ur: NEGATIVE
Nitrites, Initial: POSITIVE — AB
Specific Gravity, Urine: 1.015 (ref 1.001–1.03)
Squamous Epithelial / HPF: NONE SEEN /HPF (ref <=5)
WBC, UA: >=60 /HPF — AB (ref 0–5)
pH: 6.5 (ref 5.0–8.0)

## 2020-06-19 LAB — URINE CULTURE

## 2020-06-19 LAB — CULTURE INDICATED

## 2020-06-19 MED ORDER — AMOXICILLIN-POT CLAVULANATE 875-125 MG PO TABS: 1.0000 | ORAL_TABLET | Freq: Two times a day (BID) | ORAL | 0 refills | Status: DC

## 2020-06-19 NOTE — Progress Notes (Signed)
Please call this patient and let him know that he does have a urinary tract infection.  I have sent an antibiotic prescription for 10 days to [facility] in [place].  He needs to make a follow-up appointment in approximately 2 weeks to make sure the urine infection has cleared.  Thanks.

## 2020-07-24 ENCOUNTER — Emergency Department (HOSPITAL_COMMUNITY)
Admission: EM | Admit: 2020-07-24 | Discharge: 2020-07-24 | Disposition: A | Discharge status: Home / Self Care | Payer: PRIVATE HEALTH INSURANCE | Attending: Emergency Medicine | Admitting: Emergency Medicine

## 2020-07-24 ENCOUNTER — Emergency Department (HOSPITAL_COMMUNITY): Payer: PRIVATE HEALTH INSURANCE

## 2020-07-24 ENCOUNTER — Encounter (HOSPITAL_COMMUNITY): Payer: Self-pay | Admitting: *Deleted

## 2020-07-24 ENCOUNTER — Other Ambulatory Visit: Payer: Self-pay

## 2020-07-24 DIAGNOSIS — S92355A Nondisplaced fracture of fifth metatarsal bone, left foot, initial encounter for closed fracture: Secondary | ICD-10-CM | POA: Diagnosis not present

## 2020-07-24 DIAGNOSIS — Y990 Civilian activity done for income or pay: Secondary | ICD-10-CM | POA: Insufficient documentation

## 2020-07-24 DIAGNOSIS — F17210 Nicotine dependence, cigarettes, uncomplicated: Secondary | ICD-10-CM | POA: Insufficient documentation

## 2020-07-24 DIAGNOSIS — W28XXXA Contact with powered lawn mower, initial encounter: Secondary | ICD-10-CM | POA: Insufficient documentation

## 2020-07-24 DIAGNOSIS — I129 Hypertensive chronic kidney disease with stage 1 through stage 4 chronic kidney disease, or unspecified chronic kidney disease: Secondary | ICD-10-CM | POA: Insufficient documentation

## 2020-07-24 DIAGNOSIS — Z79899 Other long term (current) drug therapy: Secondary | ICD-10-CM | POA: Insufficient documentation

## 2020-07-24 DIAGNOSIS — R2242 Localized swelling, mass and lump, left lower limb: Secondary | ICD-10-CM | POA: Diagnosis not present

## 2020-07-24 DIAGNOSIS — N183 Chronic kidney disease, stage 3 unspecified: Secondary | ICD-10-CM | POA: Diagnosis not present

## 2020-07-24 DIAGNOSIS — R609 Edema, unspecified: Secondary | ICD-10-CM

## 2020-07-24 DIAGNOSIS — E1100 Type 2 diabetes mellitus with hyperosmolarity without nonketotic hyperglycemic-hyperosmolar coma (NKHHC): Secondary | ICD-10-CM | POA: Insufficient documentation

## 2020-07-24 DIAGNOSIS — S99922A Unspecified injury of left foot, initial encounter: Secondary | ICD-10-CM | POA: Diagnosis present

## 2020-07-24 DIAGNOSIS — I10 Essential (primary) hypertension: Secondary | ICD-10-CM

## 2020-07-24 MED ORDER — HYDROCODONE-ACETAMINOPHEN 5-325 MG PO TABS: 1.0000 | ORAL_TABLET | ORAL | 0 refills | Status: DC | PRN
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
## 2020-07-24 NOTE — Discharge Instructions (Addendum)
Elevate your left foot above your heart as much as possible for 3 days.  Use the walking boot, when you are ambulating.  Call the orthopedic doctor for a follow-up appointment next week.  We are going to modify your work activity.  Do not drive when taking the narcotic pain reliever.  See [facility] primary care doctor for a blood pressure check in 1 or 2 weeks

## 2020-07-24 NOTE — ED Triage Notes (Signed)
Pt c/o left foot injury on Monday. Pt reports his foot got caught between a hand tool and another piece of metal. Pt was wearing steel toe boots when the injury occurred. Pt has swelling to left foot. Pt has extreme difficulty ambulating.

## 2020-07-25 ENCOUNTER — Telehealth: Payer: Self-pay

## 2020-07-25 NOTE — Telephone Encounter (Signed)
Transition Care Management Unsuccessful Follow-up Telephone Call  Date of discharge and from where:  07/24/2020 from [facility]  Attempts:  1st Attempt  Reason for unsuccessful TCM follow-up call:  Left voice message

## 2020-09-17 ENCOUNTER — Ambulatory Visit (INDEPENDENT_AMBULATORY_CARE_PROVIDER_SITE_OTHER): Payer: PRIVATE HEALTH INSURANCE | Admitting: Nurse Practitioner

## 2020-09-17 ENCOUNTER — Encounter (INDEPENDENT_AMBULATORY_CARE_PROVIDER_SITE_OTHER): Payer: Self-pay | Admitting: Nurse Practitioner

## 2020-09-17 ENCOUNTER — Other Ambulatory Visit: Payer: Self-pay

## 2020-09-17 VITALS — BP 166/100 | HR 98 | Temp 97.9°F | Resp 19 | Ht 71.0 in | Wt 175.2 lb

## 2020-09-17 DIAGNOSIS — N182 Chronic kidney disease, stage 2 (mild): Secondary | ICD-10-CM

## 2020-09-17 DIAGNOSIS — I10 Essential (primary) hypertension: Secondary | ICD-10-CM

## 2020-09-17 DIAGNOSIS — E1122 Type 2 diabetes mellitus with diabetic chronic kidney disease: Secondary | ICD-10-CM

## 2020-09-17 DIAGNOSIS — D492 Neoplasm of unspecified behavior of bone, soft tissue, and skin: Secondary | ICD-10-CM

## 2020-09-17 DIAGNOSIS — Z599 Problem related to housing and economic circumstances, unspecified: Secondary | ICD-10-CM

## 2020-09-17 MED ORDER — AMLODIPINE BESYLATE 10 MG PO TABS: 10.0000 mg | ORAL_TABLET | Freq: Every day | ORAL | 1 refills | Status: DC

## 2020-09-17 NOTE — Progress Notes (Signed)
Subjective:  Patient ID: David Nicholson, male    DOB: 04-Jul-1960  Age: 60 y.o. MRN: [id number]  CC:  Chief Complaint  Patient presents with  . Follow-up       . Hypertension  . Diabetes  . Chronic Kidney Disease  . Other    Eye growth      HPI  This patient arrives today for the above.  Hypertension: She continues on amlodipine 10 mg daily.  He tells me he is not checking his blood pressure at home like he used to because he is been feeling well.  He does have an at home cuff or he can monitor his blood pressure.  Diabetes: Currently controlled by lifestyle.  Last A1c was 5.8 this is collected about 8 months ago.  He is not on statin therapy or ACE or ARB.  He did have urine checked for albuminuria a while back and it did show macroalbuminuria.  CKD: He does have stage II CKD.  Last EGFR was 73.  As stated above he is positive for macroalbuminuria and is not currently on ACE or ARB.   Bilateral eye lesions: He mentions he has lesions on both eyes on the lateral aspect of each eye.  They are not affecting his vision but he is concerned about the more like them to be evaluated.  Past Medical History:  Diagnosis Date  . Anxiety   . CKD (chronic kidney disease) stage 3, GFR 30-59 ml/min (HCC) 10/15/2019  . Diabetes mellitus without complication ([place]) 84/16/6063  . GERD (gastroesophageal reflux disease)   . Gout   . HLD (hyperlipidemia) 10/15/2019  . Hypertension   . Substance abuse ([place])       Family History  Problem Relation Age of Onset  . Diabetes Mother   . Hypertension Mother   . Diabetes Sister   . Hypertension Sister   . Diabetes Brother   . Hypertension Brother   . 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   Social History   Tobacco Use  . Smoking status: Current Every Day Smoker    Packs/day:  0.25    Years: 48.00    Pack years: 12.00    Types: Cigarettes    Last attempt to quit: 02/2018    Years since quitting: 2.6  . Smokeless tobacco: Never Used  Substance Use Topics  . Alcohol use: Yes    Comment: 2-3 beers every other day      Current Meds  Medication Sig  . allopurinol (ZYLOPRIM) 100 MG tablet Take 1 tablet (100 mg total) by mouth daily.  Marland Kitchen HYDROcodone-acetaminophen (NORCO/VICODIN) 5-325 MG tablet Take 1 tablet by mouth every 4 (four) hours as needed for moderate pain.  . sildenafil (VIAGRA) 100 MG tablet Take 0.5-1 tablets (50-100 mg total) by mouth daily as needed for erectile dysfunction.  . [DISCONTINUED] amLODipine (NORVASC) 10 MG tablet Take 1 tablet (10 mg total) by mouth daily.  . [DISCONTINUED] amoxicillin-clavulanate (AUGMENTIN) 875-125 MG tablet Take 1 tablet by mouth 2 (two) times daily.  . [DISCONTINUED] sulfamethoxazole-trimethoprim (BACTRIM DS) 800-160 MG tablet Take 1 tablet by mouth 2 (two) times daily.    ROS:  Review of Systems  Eyes: Negative for blurred vision.  Respiratory: Negative for shortness of breath.   Cardiovascular: Negative for chest pain.  Neurological: Negative for dizziness and headaches.  Objective:   Today's Vitals: BP (!) 166/100 (BP Location: Left Arm, Patient Position: Sitting, Cuff Size: Normal)   Pulse 98   Temp 97.9 F (36.6 C)   Resp 19   Ht '5\' 11"'  (1.803 m)   Wt 175 lb 3.2 oz (79.5 kg)   SpO2 99%   BMI 24.44 kg/m  Vitals with BMI 09/17/2020 07/24/2020 07/24/2020  Height '5\' 11"'  - -  Weight 175 lbs 3 oz - -  BMI 53.74 - -  Systolic 827 078 675  Diastolic 449 97 99  Pulse 98 89 73     Physical Exam Vitals reviewed.  Constitutional:      Appearance: Normal appearance.  HENT:     Head: Normocephalic and atraumatic.  Eyes:   Cardiovascular:     Rate and Rhythm: Normal rate and regular rhythm.  Pulmonary:     Effort: Pulmonary effort is normal.     Breath sounds: Normal breath sounds.   Musculoskeletal:     Cervical back: Neck supple.  Skin:    General: Skin is warm and dry.  Neurological:     Mental Status: He is alert and oriented to person, place, and time.  Psychiatric:        Mood and Affect: Mood normal.        Behavior: Behavior normal.        Thought Content: Thought content normal.        Judgment: Judgment normal.          Assessment and Plan   1. Essential hypertension   2. Growth of eyelid   3. Financial difficulties   4. Stage 2 chronic kidney disease   5. Type 2 diabetes mellitus with stage 2 chronic kidney disease, without long-term current use of insulin ([place])      Plan: 1.  He will continue on his amlodipine and he is agreeable to checking his blood pressure daily over the next 2 weeks at home.  He will bring these blood pressure readings to his next office visit.  Would recommend considering starting ACE or ARB at that time if blood pressure remains elevated. 2.  We will refer to ophthalmology for further evaluation. 3.  We will refer to social work as he did mention he is having some financial concerns regarding being able to afford his medical care. 4.  We will check CMP and check urine again for albuminuria.  Would be even more likely to consider ACE or ARB prescription if he continues to have macroalbuminuria. 5.  We will check A1c today.   Tests ordered Orders Placed This Encounter  Procedures  . Hemoglobin A1c  . CMP with eGFR([facility])  . Microalbumin/Creatinine Ratio, Urine  . Ambulatory referral to Ophthalmology  . Ambulatory referral to Social Work      Meds ordered this encounter  Medications  . amLODipine (NORVASC) 10 MG tablet    Sig: Take 1 tablet (10 mg total) by mouth daily.    Dispense:  90 tablet    Refill:  1    Order Specific Question:   Supervising Provider    Answer:   Doree Albee [[id number]]    Patient to follow-up in 1 month or sooner as needed.  Ailene Ards, NP

## 2020-09-18 LAB — COMPLETE METABOLIC PANEL WITH GFR
AG Ratio: 1.8 (calc) (ref 1.0–2.5)
ALT: 21 U/L (ref 9–46)
AST: 20 U/L (ref 10–35)
Albumin: 4.2 g/dL (ref 3.6–5.1)
Alkaline phosphatase (APISO): 80 U/L (ref 35–144)
BUN/Creatinine Ratio: 12 (calc) (ref 6–22)
BUN: 16 mg/dL (ref 7–25)
CO2: 21 mmol/L (ref 20–32)
Calcium: 9.3 mg/dL (ref 8.6–10.3)
Chloride: 103 mmol/L (ref 98–110)
Creat: 1.31 mg/dL — ABNORMAL HIGH (ref 0.70–1.25)
GFR, Est African American: 68 mL/min/{1.73_m2} (ref >=60)
GFR, Est Non African American: 58 mL/min/{1.73_m2} — ABNORMAL LOW (ref >=60)
Globulin: 2.4 g/dL (calc) (ref 1.9–3.7)
Glucose, Bld: 107 mg/dL (ref 65–139)
Potassium: 4 mmol/L (ref 3.5–5.3)
Sodium: 137 mmol/L (ref 135–146)
Total Bilirubin: 0.3 mg/dL (ref 0.2–1.2)
Total Protein: 6.6 g/dL (ref 6.1–8.1)

## 2020-09-18 LAB — MICROALBUMIN / CREATININE URINE RATIO
Creatinine, Urine: 83 mg/dL (ref 20–320)
Microalb Creat Ratio: 739 mcg/mg creat — ABNORMAL HIGH (ref <30)
Microalb, Ur: 61.3 mg/dL

## 2020-09-18 LAB — HEMOGLOBIN A1C
Hgb A1c MFr Bld: 6.3 % of total Hgb — ABNORMAL HIGH (ref <5.7)
Mean Plasma Glucose: 134 mg/dL
eAG (mmol/L): 7.4 mmol/L

## 2020-10-15 ENCOUNTER — Ambulatory Visit (INDEPENDENT_AMBULATORY_CARE_PROVIDER_SITE_OTHER): Payer: PRIVATE HEALTH INSURANCE | Admitting: Nurse Practitioner

## 2020-10-15 ENCOUNTER — Encounter (INDEPENDENT_AMBULATORY_CARE_PROVIDER_SITE_OTHER): Payer: Self-pay | Admitting: Nurse Practitioner

## 2020-10-15 ENCOUNTER — Other Ambulatory Visit: Payer: Self-pay

## 2020-10-15 ENCOUNTER — Telehealth (INDEPENDENT_AMBULATORY_CARE_PROVIDER_SITE_OTHER): Payer: Self-pay | Admitting: Nurse Practitioner

## 2020-10-15 VITALS — BP 158/84 | HR 86 | Temp 97.5°F | Ht 71.0 in | Wt 175.2 lb

## 2020-10-15 DIAGNOSIS — E1122 Type 2 diabetes mellitus with diabetic chronic kidney disease: Secondary | ICD-10-CM

## 2020-10-15 DIAGNOSIS — I10 Essential (primary) hypertension: Secondary | ICD-10-CM

## 2020-10-15 DIAGNOSIS — M79672 Pain in left foot: Secondary | ICD-10-CM

## 2020-10-15 DIAGNOSIS — E782 Mixed hyperlipidemia: Secondary | ICD-10-CM

## 2020-10-15 DIAGNOSIS — R809 Proteinuria, unspecified: Secondary | ICD-10-CM

## 2020-10-15 DIAGNOSIS — N182 Chronic kidney disease, stage 2 (mild): Secondary | ICD-10-CM | POA: Diagnosis not present

## 2020-10-15 DIAGNOSIS — N529 Male erectile dysfunction, unspecified: Secondary | ICD-10-CM

## 2020-10-15 MED ORDER — LOSARTAN POTASSIUM 25 MG PO TABS: 25.0000 mg | ORAL_TABLET | Freq: Every day | ORAL | 2 refills | Status: DC

## 2020-10-15 MED ORDER — OXYCODONE HCL 5 MG PO CAPS: 5.0000 mg | ORAL_CAPSULE | Freq: Four times a day (QID) | ORAL | 0 refills | Status: DC | PRN

## 2020-10-15 MED ORDER — SILDENAFIL CITRATE 100 MG PO TABS: 50.0000 mg | ORAL_TABLET | Freq: Every day | ORAL | 3 refills | Status: AC | PRN

## 2020-10-15 NOTE — Progress Notes (Signed)
Subjective:  Patient ID: David Nicholson, male    DOB: 1960/06/11  Age: 60 y.o. MRN: [id number]  CC:  Chief Complaint  Patient presents with  . Follow-up    Doing better than last visit, blood pressure has gone down some and has been elevated  . Hypertension  . Chronic Kidney Disease  . Diabetes  . Other    Erectile dysfunction, left foot pain  . Hyperlipidemia      HPI  This patient arrives today for the above.  Hypertension: He continues on amlodipine as tolerated medication well.  He brings at home blood pressure logs with him.  Majority of his systolic blood pressure readings are greater than 140 and as high as 176.  Diastolic readings are generally 82-100.  Chronic kidney disease: He has history of chronic kidney disease and macroalbuminuria.  Last EGFR was 68.  Creatinine was 1.31.  He does not follow with kidney doctor on a regular basis.  Diabetes: Last A1c was 6.3.  He is not on any medications.  As stated above he does have macroalbuminuria.  Erectile dysfunction: He is requesting refill on Viagra that he can take as needed.  Left foot pain: He has left foot pain that is intermittent and nature.  He tells me is very to start new job where he will be on his feet for prolonged period of time.  He is wondering if can take any pain medicine for the treatment of his foot pain so that he can complete his job.  He denies needing to operate heavy machinery for his job.  Hyperlipidemia: He does have elevated triglycerides and last LDL was 90.  He is not on any medication currently for his hyperlipidemia.  Past Medical History:  Diagnosis Date  . Anxiety   . CKD (chronic kidney disease) stage 3, GFR 30-59 ml/min (HCC) 10/15/2019  . Diabetes mellitus without complication ([place]) 51/70/0174  . GERD (gastroesophageal reflux disease)   . Gout   . HLD (hyperlipidemia) 10/15/2019  . Hypertension   . Substance abuse ([place])       Family History  Problem Relation Age of  Onset  . Diabetes Mother   . Hypertension Mother   . Diabetes Sister   . Hypertension Sister   . Diabetes Brother   . Hypertension Brother   . 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   Social History   Tobacco Use  . Smoking status: Current Every Day Smoker    Packs/day: 0.25    Years: 48.00    Pack years: 12.00    Types: Cigarettes    Last attempt to quit: 02/2018    Years since quitting: 2.6  . Smokeless tobacco: Never Used  Substance Use Topics  . Alcohol use: Yes    Comment: 2-3 beers every other day      Current Meds  Medication Sig  . acetaminophen (TYLENOL) 325 MG tablet Take 325 mg by mouth every 6 (six) hours as needed.  Marland Kitchen allopurinol (ZYLOPRIM) 100 MG tablet Take 1 tablet (100 mg total) by mouth daily.  Marland Kitchen amLODipine (NORVASC) 10 MG tablet Take 1 tablet (10 mg total) by mouth daily.  Marland Kitchen ibuprofen (ADVIL) 600 MG tablet Take 600 mg by mouth every 6 (six) hours as needed.  Marland Kitchen losartan (COZAAR) 25 MG tablet Take 1 tablet (25 mg  total) by mouth daily.  Marland Kitchen omega-3 acid ethyl esters (LOVAZA) 1 g capsule Take 1 g by mouth daily.  Marland Kitchen oxycodone (OXY-IR) 5 MG capsule Take 1 capsule (5 mg total) by mouth every 6 (six) hours as needed for up to 3 days.  . [DISCONTINUED] HYDROcodone-acetaminophen (NORCO/VICODIN) 5-325 MG tablet Take 1 tablet by mouth every 4 (four) hours as needed for moderate pain.  . [DISCONTINUED] sildenafil (VIAGRA) 100 MG tablet Take 0.5-1 tablets (50-100 mg total) by mouth daily as needed for erectile dysfunction.    ROS:  Review of Systems  Respiratory: Negative for shortness of breath.   Cardiovascular: Negative for chest pain.  Neurological: Negative for dizziness and headaches.     Objective:   Today's Vitals: BP (!) 158/84   Pulse 86   Temp (!) 97.5 F (36.4 C) (Temporal)   Ht '5\' 11"'   (1.803 m)   Wt 175 lb 3.2 oz (79.5 kg)   SpO2 98%   BMI 24.44 kg/m  Vitals with BMI 10/15/2020 09/17/2020 07/24/2020  Height '5\' 11"'  '5\' 11"'  -  Weight 175 lbs 3 oz 175 lbs 3 oz -  BMI 33.38 32.91 -  Systolic 916 606 004  Diastolic 84 599 97  Pulse 86 98 89     Physical Exam Vitals reviewed.  Constitutional:      Appearance: Normal appearance.  HENT:     Head: Normocephalic and atraumatic.  Cardiovascular:     Rate and Rhythm: Normal rate and regular rhythm.  Pulmonary:     Effort: Pulmonary effort is normal.     Breath sounds: Normal breath sounds.  Musculoskeletal:     Cervical back: Neck supple.  Skin:    General: Skin is warm and dry.  Neurological:     Mental Status: He is alert and oriented to person, place, and time.  Psychiatric:        Mood and Affect: Mood normal.        Behavior: Behavior normal.        Thought Content: Thought content normal.        Judgment: Judgment normal.          Assessment and Plan   1. Essential hypertension   2. Left foot pain   3. Erectile dysfunction, unspecified erectile dysfunction type   4. Stage 2 chronic kidney disease   5. Positive for macroalbuminuria   6. Type 2 diabetes mellitus with stage 2 chronic kidney disease, without long-term current use of insulin ([place])   7. Mixed hyperlipidemia      Plan: 1.  Blood pressure is persistently above goal.  We will start him on losartan in addition to his amlodipine.  He will follow-up in about 2 weeks for CMP check and blood pressure check.  We did discuss signs symptoms of angioedema and what to do if this were to occur as well as more common side effects of the medication and what to do if these were to occur. 2.  I recommend he first try taking Tylenol and or ibuprofen as needed for management of the pain of his foot, however for breakthrough pain he can take an low-dose of oxycodone.  He was told to avoid driving will take this medication, and it was recommended that he only  take it after work as needed as opposed to while at work.  As stated above he is not planning on operating any heavy machinery.  [place] controlled substances database checked today and he has not been  prescribed any controlled substances within the last year. 3.  Refill for Viagra sent to his pharmacy. 4.-6.  He will continue taking medications as prescribed I have started losartan as stated above. 7.  I encouraged him to consider taking an omega-3 fish oil supplement on daily basis for treatment of his elevated triglycerides.  Of note, the patient has not yet heard back from ophthalmology referral regarding the growth to his eyes.  We will ask Nellie to look into this referral.  Tests ordered No orders of the defined types were placed in this encounter.     Meds ordered this encounter  Medications  . losartan (COZAAR) 25 MG tablet    Sig: Take 1 tablet (25 mg total) by mouth daily.    Dispense:  30 tablet    Refill:  2    Order Specific Question:   Supervising Provider    Answer:   Hurshel Party C [[id number]]  . oxycodone (OXY-IR) 5 MG capsule    Sig: Take 1 capsule (5 mg total) by mouth every 6 (six) hours as needed for up to 3 days.    Dispense:  12 capsule    Refill:  0    Order Specific Question:   Supervising Provider    Answer:   Hurshel Party C [[id number]]  . sildenafil (VIAGRA) 100 MG tablet    Sig: Take 0.5 tablets (50 mg total) by mouth daily as needed for erectile dysfunction.    Dispense:  6 tablet    Refill:  3    Order Specific Question:   Supervising Provider    Answer:   Doree Albee [[id number]]    Patient to follow-up in 2 weeks or sooner as needed.  Ailene Ards, NP

## 2020-10-15 NOTE — Patient Instructions (Signed)
Losartan Tablets What is this medicine? LOSARTAN (loe SAR tan) is an angiotensin II receptor blocker, also known as an ARB. It treats high blood pressure. It can slow kidney damage in some patients. It may also be used to lower the risk of stroke. This medicine may be used for other purposes; ask your health care provider or pharmacist if you have questions. COMMON BRAND NAME(S): Cozaar What should I tell my health care provider before I take this medicine? They need to know if you have any of these conditions:  heart failure  kidney disease  liver disease  an unusual or allergic reaction to losartan, other medicines, foods, dyes, or preservatives  pregnant or trying to get pregnant  breast-feeding How should I use this medicine? Take this medicine by mouth. Take it as directed on the prescription label at the same time every day. You can take it with or without food. If it upsets your stomach, take it with food. Keep taking it unless your health care provider tells you to stop. Talk to your health care provider about the use of this medicine in children. While it may be prescribed for children as young as 6 for selected conditions, precautions do apply. Overdosage: If you think you have taken too much of this medicine contact a poison control center or emergency room at once. NOTE: This medicine is only for you. Do not share this medicine with others. What if I miss a dose? If you miss a dose, take it as soon as you can. If it is almost time for your next dose, take only that dose. Do not take double or extra doses. What may interact with this medicine?  aliskiren  ACE inhibitors, like enalapril or lisinopril  diuretics, especially amiloride, eplerenone, spironolactone, or triamterene  lithium  NSAIDs, medicines for pain and inflammation, like ibuprofen or naproxen  potassium salts or potassium supplements This list may not describe all possible interactions. Give your health  care provider a list of all the medicines, herbs, non-prescription drugs, or dietary supplements you use. Also tell them if you smoke, drink alcohol, or use illegal drugs. Some items may interact with your medicine. What should I watch for while using this medicine? Visit your health care provider for regular check ups. Check your blood pressure as directed. Ask your health care provider what your blood pressure should be. Also, find out when you should contact him or her. Do not treat yourself for coughs, colds, or pain while you are using this medicine without asking your health care provider for advice. Some medicines may increase your blood pressure. Women should inform their health care provider if they wish to become pregnant or think they might be pregnant. There is a potential for serious side effects to an unborn child. Talk to your health care provider for more information. You may get drowsy or dizzy. Do not drive, use machinery, or do anything that needs mental alertness until you know how this medicine affects you. Do not stand or sit up quickly, especially if you are an older patient. This reduces the risk of dizzy or fainting spells. Alcohol can make you more drowsy and dizzy. Avoid alcoholic drinks. Avoid salt substitutes unless you are told otherwise by your health care provider. What side effects may I notice from receiving this medicine? Side effects that you should report to your doctor or health care professional as soon as possible:  allergic reactions (skin rash, itching or hives, swelling of the hands, feet,   face, lips, throat, or tongue)  breathing problems  high potassium levels (chest pain; or fast, irregular heartbeat; muscle weakness)  kidney injury (trouble passing urine or change in the amount of urine)  low blood pressure (dizziness; feeling faint or lightheaded, falls; unusually weak or tired) Side effects that usually do not require medical attention (report to  your doctor or health care professional if they continue or are bothersome):  cough  headache  nasal congestion or stuffiness  nausea or stomach pain This list may not describe all possible side effects. Call your doctor for medical advice about side effects. You may report side effects to FDA at 1-800-FDA-1088. Where should I keep my medicine? Keep out of the reach of children and pets. Store at room temperature between 20 and 25 degrees C (68 and 77 degrees F). Protect from light. Keep the container tightly closed. Get rid of any unused medicine after the expiration date. To get rid of medicines that are no longer needed or have expired:  Take the medicine to a medicine take-back program. Check with your pharmacy or law enforcement to find a location.  If you cannot return the medicine, check the label or package insert to see if the medicine should be thrown out in the garbage or flushed down the toilet. If you are not sure, ask your health care provider. If it is safe to put in the trash, empty the medicine out of the container. Mix the medicine with cat litter, dirt, coffee grounds, or other unwanted substance. Seal the mixture in a bag or container. Put it in the trash. NOTE: This sheet is a summary. It may not cover all possible information. If you have questions about this medicine, talk to your doctor, pharmacist, or health care provider.  2021 Elsevier/Gold Standard (2019-08-02 16:16:09)  

## 2020-10-15 NOTE — Telephone Encounter (Signed)
Please look into eye doctor referral for patient he stated that he has not heard from the referral.

## 2020-10-16 ENCOUNTER — Other Ambulatory Visit (INDEPENDENT_AMBULATORY_CARE_PROVIDER_SITE_OTHER): Payer: Self-pay | Admitting: Nurse Practitioner

## 2020-10-16 DIAGNOSIS — M79672 Pain in left foot: Secondary | ICD-10-CM

## 2020-10-16 MED ORDER — OXYCODONE HCL 5 MG PO CAPS: 5.0000 mg | ORAL_CAPSULE | Freq: Every day | ORAL | 0 refills | Status: DC | PRN

## 2020-10-16 NOTE — Telephone Encounter (Signed)
You're welcome!

## 2020-10-16 NOTE — Telephone Encounter (Signed)
Okay I will print and resend all information to them. So the 1st provider I sent in [place] is not accepting;unknown. So I sent to Dr Marylynn Pearson, [place].

## 2020-10-16 NOTE — Telephone Encounter (Signed)
Thank you :)

## 2020-10-16 NOTE — Progress Notes (Signed)
Ordering changed to patient's oxycodone prescription for as needed for pain.  As stated in office visit note from 10/15/2020 I did recommend the patient try over-the-counter remedies such as Tylenol and or ibuprofen before taking oxycodone.  He should only take the oxycodone very rarely and once a day as needed for pain management for now.  Thus we will reduce his prescription from 5 mg every 6 hours to 5 mg daily as needed for up to 3 days.  I have called the patient's pharmacy and have discussed this change.  They will cancel the prescription sent yesterday and requested that I send a new prescription to reflect changes today. New prescription being sent in today.

## 2020-11-20 ENCOUNTER — Other Ambulatory Visit: Payer: Self-pay

## 2020-11-20 ENCOUNTER — Ambulatory Visit (INDEPENDENT_AMBULATORY_CARE_PROVIDER_SITE_OTHER): Payer: PRIVATE HEALTH INSURANCE | Admitting: Internal Medicine

## 2020-11-20 ENCOUNTER — Encounter (INDEPENDENT_AMBULATORY_CARE_PROVIDER_SITE_OTHER): Payer: Self-pay | Admitting: Internal Medicine

## 2020-11-20 VITALS — BP 156/90 | HR 96 | Temp 98.0°F | Resp 18 | Ht 71.0 in | Wt 178.0 lb

## 2020-11-20 DIAGNOSIS — N182 Chronic kidney disease, stage 2 (mild): Secondary | ICD-10-CM

## 2020-11-20 DIAGNOSIS — I10 Essential (primary) hypertension: Secondary | ICD-10-CM | POA: Diagnosis not present

## 2020-11-20 NOTE — Progress Notes (Signed)
Metrics: Intervention Frequency ACO  Documented Smoking Status Yearly  Screened one or more times in 24 months  Cessation Counseling or  Active cessation medication Past 24 months  Past 24 months   Guideline developer: UpToDate (See UpToDate for funding source) Date Released: 2014       Wellness Office Visit  Subjective:  Patient ID: David Nicholson, male    DOB: Feb 17, 1961  Age: 60 y.o. MRN: [id number]  CC: This man comes in for follow-up of uncontrolled hypertension.  He is also prediabetic and has a degree of chronic kidney disease. HPI  Unfortunately, he ran out of his losartan medications a couple of days ago and has not taken it for 2 days.  He apparently has had financial difficulties try to afford it.  His wife, who is present with him, says that she will make sure she she gets the medication for him.  He also continues to take amlodipine 10 mg daily. Unfortunately, he has not been drinking enough water every day. Past Medical History:  Diagnosis Date   Anxiety    CKD (chronic kidney disease) stage 3, GFR 30-59 ml/min (HCC) 10/15/2019   Diabetes mellitus without complication ([place]) 50/38/8828   GERD (gastroesophageal reflux disease)    Gout    HLD (hyperlipidemia) 10/15/2019   Hypertension    Substance abuse ([place])    Past Surgical History:  Procedure Laterality Date   COLONOSCOPY  06/30/2011   Procedure: COLONOSCOPY;  Surgeon: Daneil Dolin, MD;  [facility];  Service: Endoscopy;  Laterality: N/A;  7:30 AM     Family History  Problem Relation Age of Onset   Diabetes Mother    Hypertension Mother    Diabetes Sister    Hypertension Sister    Diabetes Brother    Hypertension Brother    Alzheimer's disease Father     Social History   Social History Narrative   Single,common-law wife of 18 years.Lives with common-law wife.Used to make hand wipes,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   Social History   Tobacco Use   Smoking status: Every Day     Packs/day: 0.25    Years: 48.00    Pack years: 12.00    Types: Cigarettes    Last attempt to quit: 02/2018    Years since quitting: 2.7   Smokeless tobacco: Never  Substance Use Topics   Alcohol use: Yes    Comment: 2-3 beers every other day     Current Meds  Medication Sig   acetaminophen (TYLENOL) 325 MG tablet Take 325 mg by mouth every 6 (six) hours as needed.   allopurinol (ZYLOPRIM) 100 MG tablet Take 1 tablet (100 mg total) by mouth daily.   amLODipine (NORVASC) 10 MG tablet Take 1 tablet (10 mg total) by mouth daily.   ibuprofen (ADVIL) 600 MG tablet Take 600 mg by mouth every 6 (six) hours as needed.   losartan (COZAAR) 25 MG tablet Take 1 tablet (25 mg total) by mouth daily.   omega-3 acid ethyl esters (LOVAZA) 1 g capsule Take 1 g by mouth daily.   oxycodone (OXY-IR) 5 MG capsule Take 1 capsule (5 mg total) by mouth daily as needed for pain.   sildenafil (VIAGRA) 100 MG tablet Take 0.5 tablets (50 mg total) by mouth daily as needed for erectile dysfunction.     [place] Office Visit from 10/15/2020 in [facility]  PHQ-9 Total Score 0       Objective:   Today's Vitals: BP Marland Kitchen)  156/90 (BP Location: Right Arm, Patient Position: Sitting, Cuff Size: Normal)   Pulse 96   Temp 98 F (36.7 C) (Temporal)   Resp 18   Ht 5\' 11"  (1.803 m)   Wt 178 lb (80.7 kg)   SpO2 96%   BMI 24.83 kg/m  Vitals with BMI 11/20/2020 10/15/2020 09/17/2020  Height 5\' 11"  5\' 11"  5\' 11"   Weight 178 lbs 175 lbs 3 oz 175 lbs 3 oz  BMI 24.84 06.26 94.85  Systolic 462 703 500  Diastolic 90 84 938  Pulse 96 86 98     Physical Exam  He looks systemically well.  Blood pressure is uncontrolled.  However, he is alert and orientated without any obvious focal neurological signs.     Assessment   1. Essential hypertension   2. Stage 2 chronic kidney disease       Tests ordered No orders of the defined types were placed in this encounter.    Plan: 1.  He needs to  continue with amlodipine 10 mg daily and also losartan 25 mg daily.  I have stressed the importance of taking both of these medications every single day and if it is unaffordable, we can try to prescribe other medications for hypertension which may be affordable.  The wife reassured me that she will make sure that the patient gets the medications and takes them. 2.  I discussed the importance of hydration every day. 3.  He will follow-up with Judson Roch in about a month's time for close monitoring of his hypertension and at that time I would recommend blood work be done to check renal function and also hemoglobin A1c as he is a prediabetic.    No orders of the defined types were placed in this encounter.   Doree Albee, MD

## 2020-11-20 NOTE — Progress Notes (Signed)
Pt just got back on a job. She is gave him some pain medications to help he went to the doctor. He just got off work.

## 2020-11-29 ENCOUNTER — Other Ambulatory Visit (INDEPENDENT_AMBULATORY_CARE_PROVIDER_SITE_OTHER): Payer: Self-pay | Admitting: Nurse Practitioner

## 2020-11-29 DIAGNOSIS — M79672 Pain in left foot: Secondary | ICD-10-CM

## 2020-12-03 ENCOUNTER — Emergency Department (HOSPITAL_COMMUNITY): Payer: PRIVATE HEALTH INSURANCE

## 2020-12-03 ENCOUNTER — Encounter (HOSPITAL_COMMUNITY): Payer: Self-pay

## 2020-12-03 ENCOUNTER — Other Ambulatory Visit: Payer: Self-pay

## 2020-12-03 ENCOUNTER — Emergency Department (HOSPITAL_COMMUNITY)
Admission: EM | Admit: 2020-12-03 | Discharge: 2020-12-03 | Disposition: A | Discharge status: Home / Self Care | Payer: PRIVATE HEALTH INSURANCE | Attending: Emergency Medicine | Admitting: Emergency Medicine

## 2020-12-03 DIAGNOSIS — F17210 Nicotine dependence, cigarettes, uncomplicated: Secondary | ICD-10-CM | POA: Diagnosis not present

## 2020-12-03 DIAGNOSIS — Y990 Civilian activity done for income or pay: Secondary | ICD-10-CM | POA: Diagnosis not present

## 2020-12-03 DIAGNOSIS — Y9389 Activity, other specified: Secondary | ICD-10-CM | POA: Insufficient documentation

## 2020-12-03 DIAGNOSIS — N183 Chronic kidney disease, stage 3 unspecified: Secondary | ICD-10-CM | POA: Diagnosis not present

## 2020-12-03 DIAGNOSIS — Y9289 Other specified places as the place of occurrence of the external cause: Secondary | ICD-10-CM | POA: Diagnosis not present

## 2020-12-03 DIAGNOSIS — I129 Hypertensive chronic kidney disease with stage 1 through stage 4 chronic kidney disease, or unspecified chronic kidney disease: Secondary | ICD-10-CM | POA: Diagnosis not present

## 2020-12-03 DIAGNOSIS — T148XXA Other injury of unspecified body region, initial encounter: Secondary | ICD-10-CM

## 2020-12-03 DIAGNOSIS — Z79899 Other long term (current) drug therapy: Secondary | ICD-10-CM | POA: Insufficient documentation

## 2020-12-03 DIAGNOSIS — W228XXA Striking against or struck by other objects, initial encounter: Secondary | ICD-10-CM | POA: Diagnosis not present

## 2020-12-03 DIAGNOSIS — M546 Pain in thoracic spine: Secondary | ICD-10-CM | POA: Insufficient documentation

## 2020-12-03 DIAGNOSIS — E1122 Type 2 diabetes mellitus with diabetic chronic kidney disease: Secondary | ICD-10-CM | POA: Insufficient documentation

## 2020-12-03 MED ORDER — DICLOFENAC SODIUM 1 % EX GEL: 2.0000 g | Freq: Four times a day (QID) | CUTANEOUS | 0 refills | Status: DC

## 2020-12-03 MED ORDER — METHOCARBAMOL 500 MG PO TABS: 500.0000 mg | ORAL_TABLET | Freq: Two times a day (BID) | ORAL | 0 refills | Status: DC

## 2020-12-03 MED ORDER — ACETAMINOPHEN 325 MG PO TABS
650.0000 mg | ORAL_TABLET | Freq: Once | ORAL | Status: AC
  Administered 2020-12-03: 650 mg via ORAL
  Filled 2020-12-03: qty 2

## 2020-12-03 NOTE — Discharge Instructions (Addendum)
The x-ray of your back did not show any broken bones.  You likely have some straining of your muscles from your injury today.  I have given you a muscle relaxer called Robaxin.  Take as directed.  Do not operate machinery, work or drink alcohol while taking this medication as it can make you very sleepy.  You are also given a prescription for Voltaren gel which is an anti-inflammatory/pain medication that can help with your symptoms.  Please use as directed.  Please follow-up with your regular doctor in 1 week for reassessment and return to the emergency department for any new or worsening symptoms.

## 2020-12-03 NOTE — ED Triage Notes (Signed)
Pt to er, pt states that he was at work and had an accident and a pipe bent his back  backwards, states that now his back is hurting him.  Pt denies numbness or tingling in his legs.

## 2020-12-03 NOTE — ED Notes (Signed)
Pt gone to xray

## 2020-12-03 NOTE — ED Provider Notes (Signed)
[facility] EMERGENCY DEPARTMENT Provider Note   CSN: [id number] Arrival date & time: 12/03/20  1725     History Chief Complaint  Patient presents with   Back Pain    Alp Goldwater is a 60 y.o. male.  HPI  60 year old male with history of anxiety, CKD, diabetes, GERD  High on hyperlipidemia, hypertension, substance abuse, who presents to the emergency department today complaining of upper back pain.  States that he was at work on a [employer] some pipes when the lift driver started driving and he got caught in between the pipe and the lift.  He states his chin was stuck and it bent his neck and upper back backwards.  Since then he has had a gradual onset of muscle pain to the bilateral upper back.  He denies any neck pain, head injury, headache, vision changes numbness or weakness.  Past Medical History:  Diagnosis Date   Anxiety    CKD (chronic kidney disease) stage 3, GFR 30-59 ml/min (HCC) 10/15/2019   Diabetes mellitus without complication ([place]) 36/64/4034   GERD (gastroesophageal reflux disease)    Gout    HLD (hyperlipidemia) 10/15/2019   Hypertension    Substance abuse [facility])     Patient Active Problem List   Diagnosis Date Noted   CKD (chronic kidney disease) stage 3, GFR 30-59 ml/min ([place]) 10/15/2019   HLD (hyperlipidemia) 10/15/2019   Diabetes mellitus with hyperosmolarity without hyperglycemic hyperosmolar nonketotic coma ([place]) 07/25/2018   Hypertension 07/25/2018    Past Surgical History:  Procedure Laterality Date   COLONOSCOPY  06/30/2011   Procedure: COLONOSCOPY;  Surgeon: Daneil Dolin, MD;  [facility];  Service: Endoscopy;  Laterality: N/A;  7:30 AM       Family History  Problem Relation Age of Onset   Diabetes Mother    Hypertension Mother    Diabetes Sister    Hypertension Sister    Diabetes Brother    Hypertension Brother    Alzheimer's disease Father     Social History   Tobacco Use   Smoking status: Every Day     Packs/day: 0.25    Years: 48.00    Pack years: 12.00    Types: Cigarettes    Last attempt to quit: 02/2018    Years since quitting: 2.8   Smokeless tobacco: Never  Vaping Use   Vaping Use: Never used  Substance Use Topics   Alcohol use: Yes    Comment: 2-3 beers every other day    Drug use: Not Currently    Types: Cocaine, Marijuana, Oxycodone    Comment: none since 02/2018.     Home Medications Prior to Admission medications   Medication Sig Start Date End Date Taking? Authorizing Provider  acetaminophen (TYLENOL) 325 MG tablet Take 325 mg by mouth every 6 (six) hours as needed.    [provider]  allopurinol (ZYLOPRIM) 100 MG tablet Take 1 tablet (100 mg total) by mouth daily. 12/05/19   Hurshel Party C, MD  amLODipine (NORVASC) 10 MG tablet Take 1 tablet (10 mg total) by mouth daily. 09/17/20   Ailene Ards, NP  ibuprofen (ADVIL) 600 MG tablet Take 600 mg by mouth every 6 (six) hours as needed.    [provider]  losartan (COZAAR) 25 MG tablet Take 1 tablet (25 mg total) by mouth daily. 10/15/20   Ailene Ards, NP  omega-3 acid ethyl esters (LOVAZA) 1 g capsule Take 1 g by mouth daily.  [provider]  oxycodone (OXY-IR) 5 MG capsule Take 1 capsule (5 mg total) by mouth daily as needed for pain. 10/16/20   Ailene Ards, NP  sildenafil (VIAGRA) 100 MG tablet Take 0.5 tablets (50 mg total) by mouth daily as needed for erectile dysfunction. 10/15/20   Ailene Ards, NP    Allergies    Patient has no known allergies.  Review of Systems   Review of Systems  Constitutional:  Negative for fever.  Musculoskeletal:  Positive for back pain. Negative for neck pain.  Neurological:  Negative for weakness and numbness.   Physical Exam Updated Vital Signs BP (!) 137/92 (BP Location: Right Arm)   Pulse (!) 103   Temp 98.4 F (36.9 C) (Oral)   Resp 18   Ht 5\' 11"  (1.803 m)   Wt 77.1 kg   SpO2 97%   BMI 23.71 kg/m   Physical Exam Constitutional:       General: He is not in acute distress.    Appearance: He is well-developed.  Eyes:     Conjunctiva/sclera: Conjunctivae normal.  Cardiovascular:     Rate and Rhythm: Normal rate and regular rhythm.  Pulmonary:     Effort: Pulmonary effort is normal.     Breath sounds: Normal breath sounds.  Musculoskeletal:     Comments: No midline cervical or lumbar spine tenderness.  There is TTP to the mid thoracic spine with some associated paraspinous muscle tenderness bilaterally and tenderness over the rhomboids bilaterally.  There is some evidence of muscle spasm and there is some mild swelling and erythema.  Skin:    General: Skin is warm and dry.  Neurological:     Mental Status: He is alert and oriented to person, place, and time.     Comments: 5/5 strength to the bilateral upper and lower extremities.  Normal sensation throughout.    ED Results / Procedures / Treatments   Labs (all labs ordered are listed, but only abnormal results are displayed) Labs Reviewed - No data to display  EKG None  Radiology DG Thoracic Spine 2 View  Result Date: 12/03/2020 CLINICAL DATA:  Back pain EXAM: THORACIC SPINE 2 VIEWS COMPARISON:  02/24/2020 FINDINGS: There is no evidence of thoracic spine fracture. Alignment is normal. No other significant bone abnormalities are identified. IMPRESSION: Negative. Electronically Signed   By: Van Clines M.D.   On: 12/03/2020 18:50    Procedures Procedures   Medications Ordered in ED Medications  acetaminophen (TYLENOL) tablet 650 mg (650 mg Oral Given 12/03/20 1851)    ED Course  I have reviewed the triage vital signs and the nursing notes.  Pertinent labs & imaging results that were available during my care of the patient were reviewed by me and considered in my medical decision making (see chart for details).    MDM Rules/Calculators/A&P                          60 year old male presenting to the emergency department today complaining of back  pain that occurred after an injury at work today.  He is neurovascularly intact.  Does not have any chest pain, shortness of breath, neck pain and there was no head injury.  X-ray of the thoracic spine was obtained and is negative for any traumatic bony injury.  I suspect that his symptoms are likely muscular in nature.  Will give Rx for muscle relaxers for home and Voltaren gel.  Have advised  on close follow-up and strict return precautions.  He voices understanding of the plan and reasons to return.  All questions answered.  Patient stable for discharge.  Final Clinical Impression(s) / ED Diagnoses Final diagnoses:  None    Rx / DC Orders ED Discharge Orders     None        Bishop Dublin 12/03/20 1934    Milton Ferguson, MD 12/05/20 [phone]

## 2020-12-24 ENCOUNTER — Ambulatory Visit (INDEPENDENT_AMBULATORY_CARE_PROVIDER_SITE_OTHER): Payer: No Typology Code available for payment source | Admitting: Nurse Practitioner

## 2020-12-24 ENCOUNTER — Encounter (INDEPENDENT_AMBULATORY_CARE_PROVIDER_SITE_OTHER): Payer: Self-pay | Admitting: Nurse Practitioner

## 2020-12-24 ENCOUNTER — Other Ambulatory Visit: Payer: Self-pay

## 2020-12-24 VITALS — BP 146/80 | HR 102 | Ht 71.0 in | Wt 172.8 lb

## 2020-12-24 DIAGNOSIS — E1122 Type 2 diabetes mellitus with diabetic chronic kidney disease: Secondary | ICD-10-CM

## 2020-12-24 DIAGNOSIS — R809 Proteinuria, unspecified: Secondary | ICD-10-CM

## 2020-12-24 DIAGNOSIS — N182 Chronic kidney disease, stage 2 (mild): Secondary | ICD-10-CM

## 2020-12-24 DIAGNOSIS — I10 Essential (primary) hypertension: Secondary | ICD-10-CM

## 2020-12-24 LAB — COMPLETE METABOLIC PANEL WITH GFR
AG Ratio: 1.9 (calc) (ref 1.0–2.5)
ALT: 19 U/L (ref 9–46)
AST: 18 U/L (ref 10–35)
Albumin: 4.6 g/dL (ref 3.6–5.1)
Alkaline phosphatase (APISO): 73 U/L (ref 35–144)
BUN/Creatinine Ratio: 12 (calc) (ref 6–22)
BUN: 20 mg/dL (ref 7–25)
CO2: 23 mmol/L (ref 20–32)
Calcium: 9.5 mg/dL (ref 8.6–10.3)
Chloride: 104 mmol/L (ref 98–110)
Creat: 1.65 mg/dL — ABNORMAL HIGH (ref 0.70–1.35)
Globulin: 2.4 g/dL (calc) (ref 1.9–3.7)
Glucose, Bld: 108 mg/dL — ABNORMAL HIGH (ref 65–99)
Potassium: 4 mmol/L (ref 3.5–5.3)
Sodium: 137 mmol/L (ref 135–146)
Total Bilirubin: 0.3 mg/dL (ref 0.2–1.2)
Total Protein: 7 g/dL (ref 6.1–8.1)
eGFR: 47 mL/min/{1.73_m2} — ABNORMAL LOW (ref >=60)

## 2020-12-24 MED ORDER — LOSARTAN POTASSIUM 25 MG PO TABS: 25.0000 mg | ORAL_TABLET | Freq: Every day | ORAL | 3 refills | Status: DC

## 2020-12-24 MED ORDER — AMLODIPINE BESYLATE 10 MG PO TABS: 10.0000 mg | ORAL_TABLET | Freq: Every day | ORAL | 3 refills | Status: DC

## 2020-12-24 NOTE — Progress Notes (Signed)
Subjective:  Patient ID: David Nicholson, male    DOB: 04/03/1961  Age: 60 y.o. MRN: [id number]  CC:  Chief Complaint  Patient presents with   Hypertension    Follow up   Other    Macroalbuminuria   Diabetes      HPI  This patient arrives today for the above.  Hypertension: He continues on amlodipine and losartan he is due to have his metabolic panel checked today.  He tells me tolerating medication well.  Macroalbuminuria/diabetes: He has a history of diabetes and is controlled by lifestyle.  Last A1c was 6.3 this was collected about 3 months ago.  He does have a history of macroalbuminuria but he is on ARB.  He has not seen a nephrologist.  Last urine level was 739.  Past Medical History:  Diagnosis Date   Anxiety    CKD (chronic kidney disease) stage 3, GFR 30-59 ml/min (HCC) 10/15/2019   Diabetes mellitus without complication ([place]) 31/51/7616   GERD (gastroesophageal reflux disease)    Gout    HLD (hyperlipidemia) 10/15/2019   Hypertension    Substance abuse ([place])       Family History  Problem Relation Age of Onset   Diabetes Mother    Hypertension Mother    Diabetes Sister    Hypertension Sister    Diabetes Brother    Hypertension Brother    Alzheimer's disease Father     Social History   Social History Narrative   Single,common-law wife of 90 years.Lives with common-law wife.Used to make hand wipes,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   Social History   Tobacco Use   Smoking status: Every Day    Packs/day: 0.25    Years: 48.00    Pack years: 12.00    Types: Cigarettes    Last attempt to quit: 02/2018    Years since quitting: 2.8   Smokeless tobacco: Never  Substance Use Topics   Alcohol use: Yes    Comment: 2-3 beers every other day      Current Meds  Medication Sig   acetaminophen (TYLENOL) 325 MG tablet Take 325 mg by mouth every 6 (six) hours as needed.   diclofenac Sodium (VOLTAREN) 1 % GEL Apply 2 g topically 4 (four) times daily.    ibuprofen (ADVIL) 600 MG tablet Take 600 mg by mouth every 6 (six) hours as needed.   omega-3 acid ethyl esters (LOVAZA) 1 g capsule Take 1 g by mouth daily.   sildenafil (VIAGRA) 100 MG tablet Take 0.5 tablets (50 mg total) by mouth daily as needed for erectile dysfunction.   [DISCONTINUED] amLODipine (NORVASC) 10 MG tablet Take 1 tablet (10 mg total) by mouth daily.   [DISCONTINUED] losartan (COZAAR) 25 MG tablet Take 1 tablet (25 mg total) by mouth daily.    ROS:  Review of Systems  Respiratory:  Negative for shortness of breath.   Cardiovascular:  Negative for chest pain.  Neurological:  Negative for dizziness and headaches.    Objective:   Today's Vitals: BP (!) 146/80 (BP Location: Left Arm, Patient Position: Sitting, Cuff Size: Large)   Pulse (!) 102   Ht '5\' 11"'  (1.803 m)   Wt 172 lb 12.8 oz (78.4 kg)   SpO2 98%   BMI 24.10 kg/m  Vitals with BMI 12/24/2020 12/03/2020 12/03/2020  Height '5\' 11"'  - -  Weight 172 lbs 13 oz - -  BMI 07.37 - -  Systolic 106 269 485  Diastolic 80 96 92  Pulse 102 91 103     Physical Exam Vitals reviewed.  Constitutional:      Appearance: Normal appearance.  HENT:     Head: Normocephalic and atraumatic.  Cardiovascular:     Rate and Rhythm: Normal rate and regular rhythm.  Pulmonary:     Effort: Pulmonary effort is normal.     Breath sounds: Normal breath sounds.  Musculoskeletal:     Cervical back: Neck supple.  Skin:    General: Skin is warm and dry.  Neurological:     Mental Status: He is alert and oriented to person, place, and time.  Psychiatric:        Mood and Affect: Mood normal.        Behavior: Behavior normal.        Thought Content: Thought content normal.        Judgment: Judgment normal.         Assessment and Plan   1. Positive for macroalbuminuria   2. Essential hypertension   3. Type 2 diabetes mellitus with stage 2 chronic kidney disease, without long-term current use of insulin ([place])       Plan: 1.-3.  He will continue taking his medications as prescribed.  I did discuss possibly seeing a nephrologist at least for 1 visit to make sure were doing all that we can to help protect his kidneys, however due to financial barriers he would like to hold off on referral at this time.  We will recheck urine for albumin levels to see if this has reduced since starting the losartan.   Tests ordered Orders Placed This Encounter  Procedures   CMP with eGFR([facility])   Microalbumin/Creatinine Ratio, Urine      Meds ordered this encounter  Medications   amLODipine (NORVASC) 10 MG tablet    Sig: Take 1 tablet (10 mg total) by mouth daily.    Dispense:  30 tablet    Refill:  3    Order Specific Question:   Supervising Provider    Answer:   Hurshel Party C [[id number]]   losartan (COZAAR) 25 MG tablet    Sig: Take 1 tablet (25 mg total) by mouth daily.    Dispense:  30 tablet    Refill:  3    Order Specific Question:   Supervising Provider    Answer:   Doree Albee [[id number]]    Patient to follow-up in 3 months or sooner as needed.  Ailene Ards, NP

## 2020-12-25 LAB — MICROALBUMIN / CREATININE URINE RATIO
Creatinine, Urine: 191 mg/dL (ref 20–320)
Microalb Creat Ratio: 420 mcg/mg creat — ABNORMAL HIGH (ref <30)
Microalb, Ur: 80.3 mg/dL

## 2020-12-31 ENCOUNTER — Other Ambulatory Visit (INDEPENDENT_AMBULATORY_CARE_PROVIDER_SITE_OTHER): Payer: Self-pay | Admitting: Nurse Practitioner

## 2020-12-31 DIAGNOSIS — R809 Proteinuria, unspecified: Secondary | ICD-10-CM

## 2020-12-31 NOTE — Progress Notes (Signed)
Please have nephrology call the patient's wife's phone number to schedule an appt as his phone is not working. That phone number is [phone]. Thank you.

## 2021-01-04 IMAGING — CT CT RENAL STONE PROTOCOL
2 of 4 series · 16 of 46 positions shown, 18 images · non-contrast
Comparison: None.

CLINICAL DATA: Right flank pain x2 days.

EXAM:
CT ABDOMEN AND PELVIS WITHOUT CONTRAST
TECHNIQUE: Multidetector CT imaging of the abdomen and pelvis was performed
following the standard protocol without IV contrast.

[Series 2: axial st · axial · 0.81mm/px · z∈[-749,-309]mm · 13 of 100 slices shown, 15 images]
[im 6/100  soft-tissue]
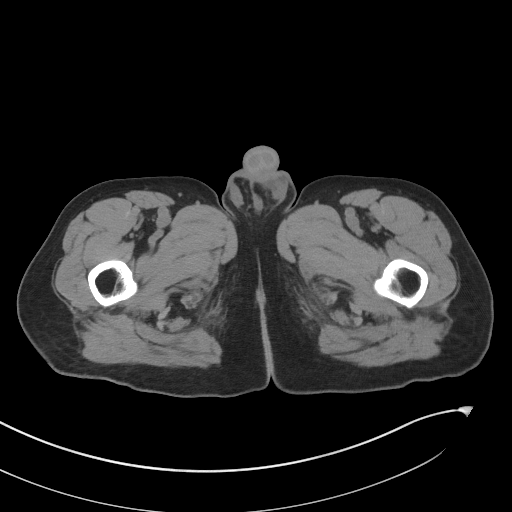
[im 6/100  bone]
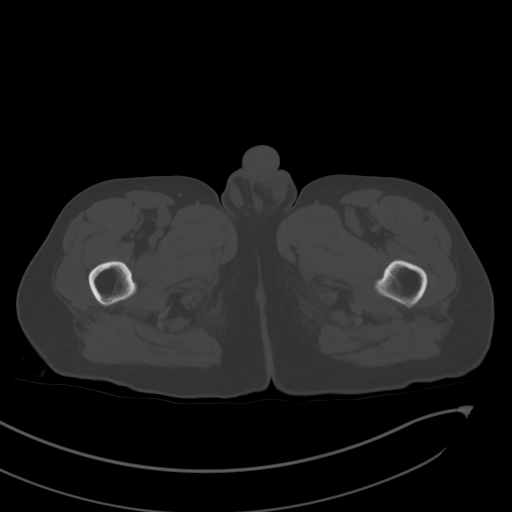
[im 16/100  soft-tissue]
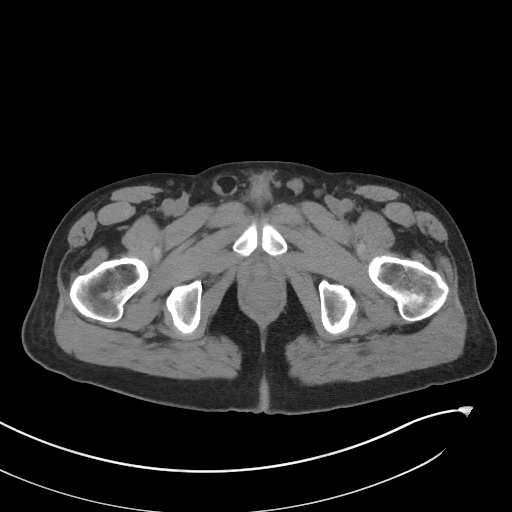
[im 21/100  soft-tissue]
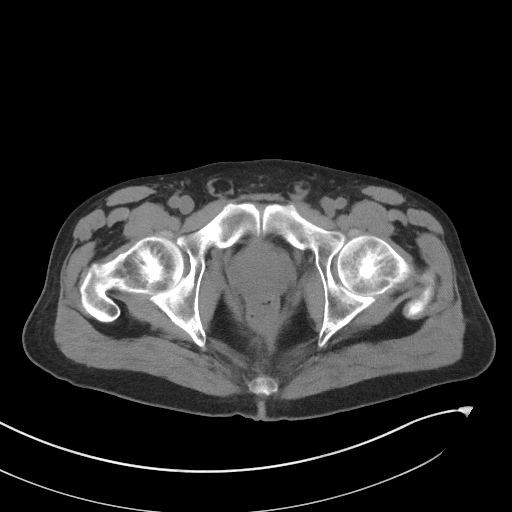
[im 27/100  soft-tissue]
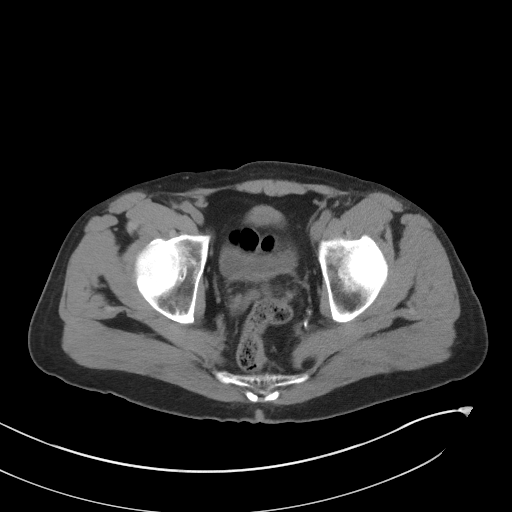
[im 37/100  soft-tissue]
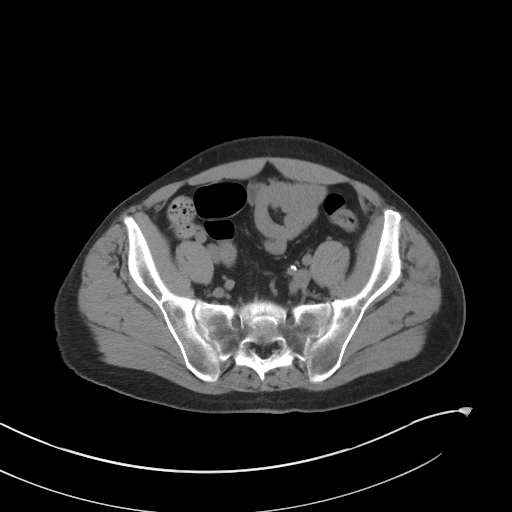
[im 42/100  soft-tissue]
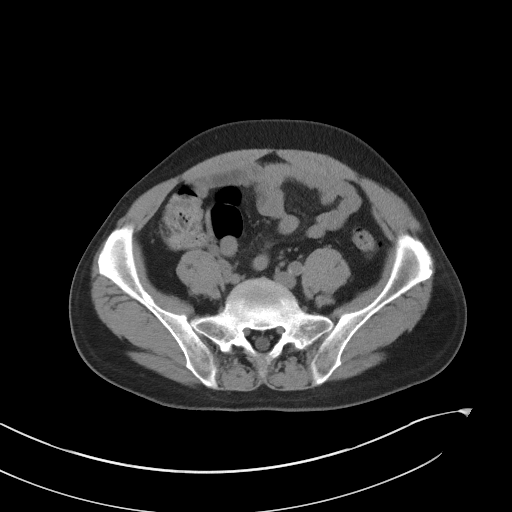
[im 53/100  soft-tissue]
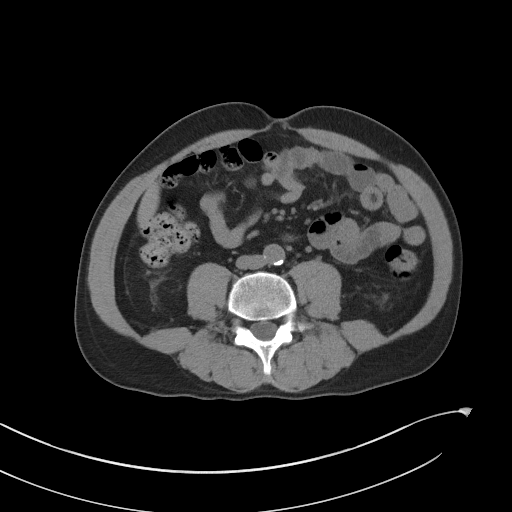
[im 58/100  soft-tissue]
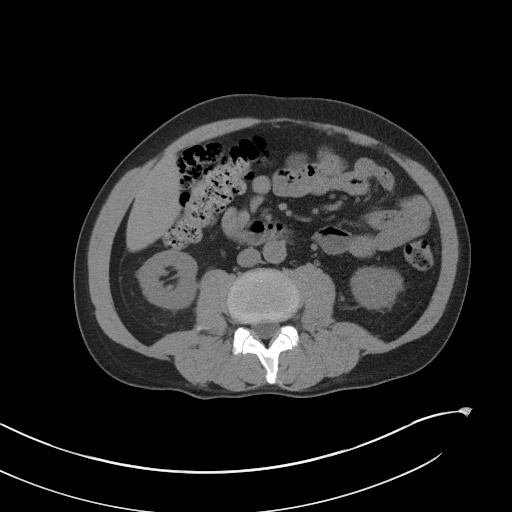
[im 63/100  soft-tissue]
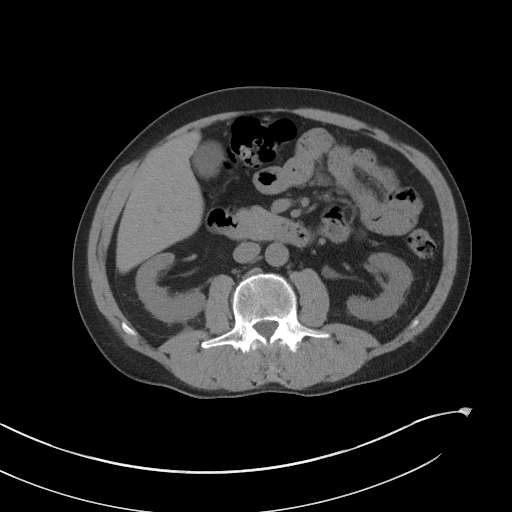
[im 63/100  bone]
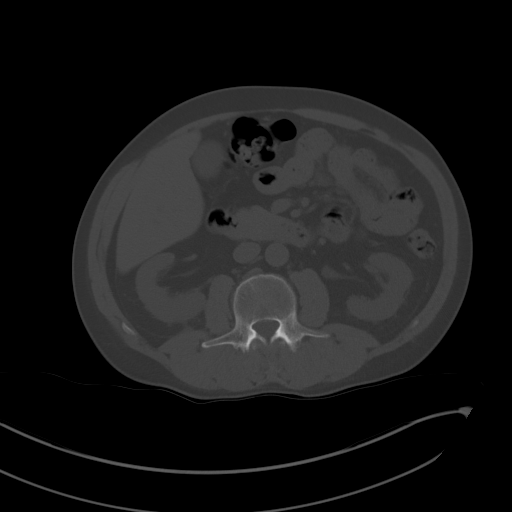
[im 73/100  soft-tissue]
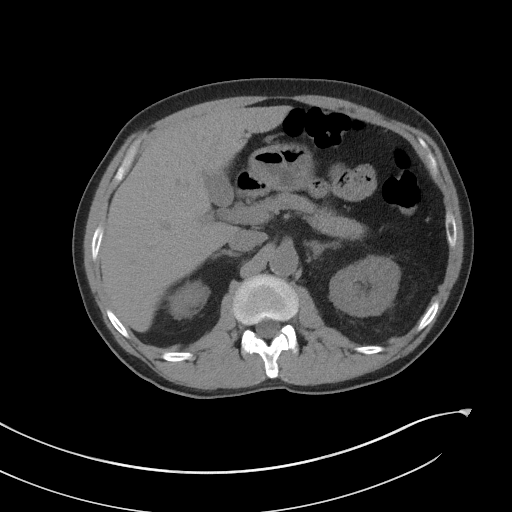
[im 79/100  soft-tissue]
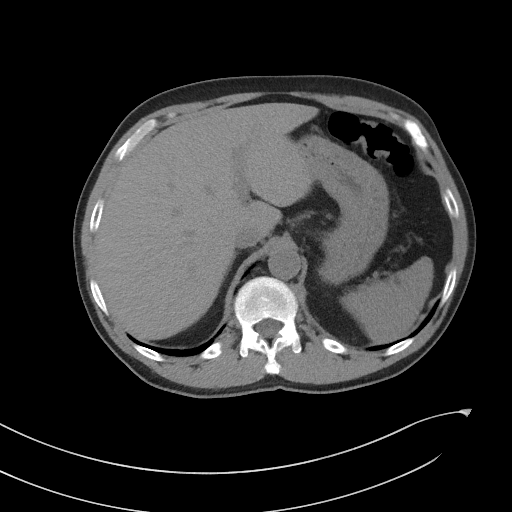
[im 84/100  soft-tissue]
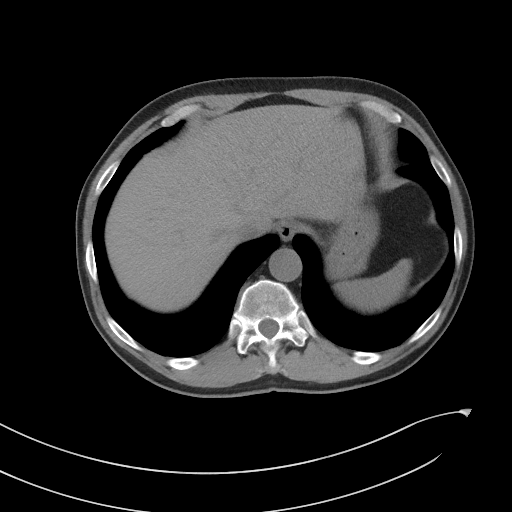
[im 94/100  soft-tissue]
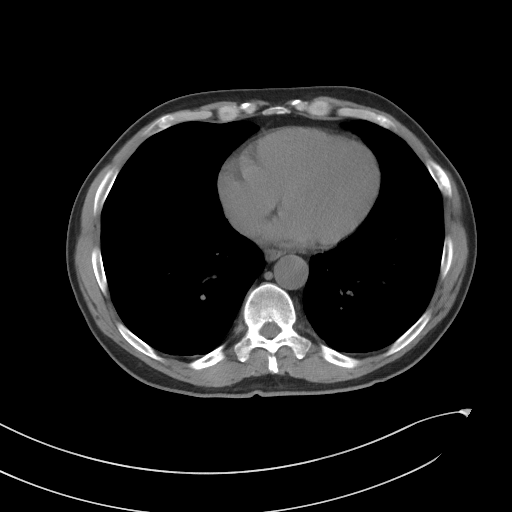

[Series 5: coronal st · coronal · 0.74mm/px · 3 of 108 slices shown]
[im 36/108  soft-tissue]
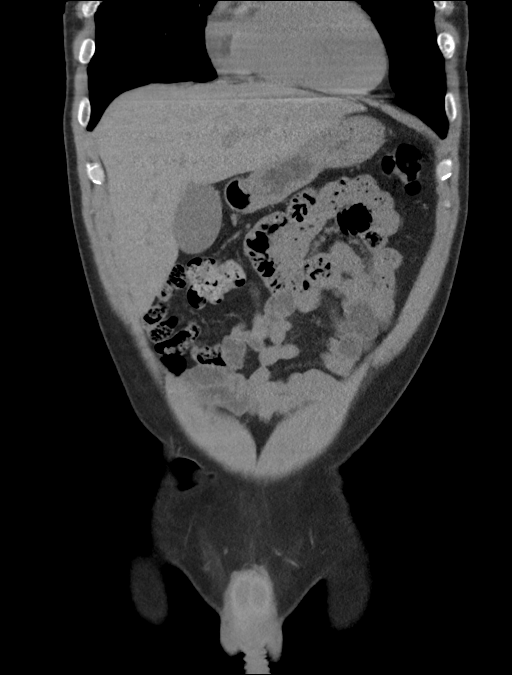
[im 48/108  soft-tissue]
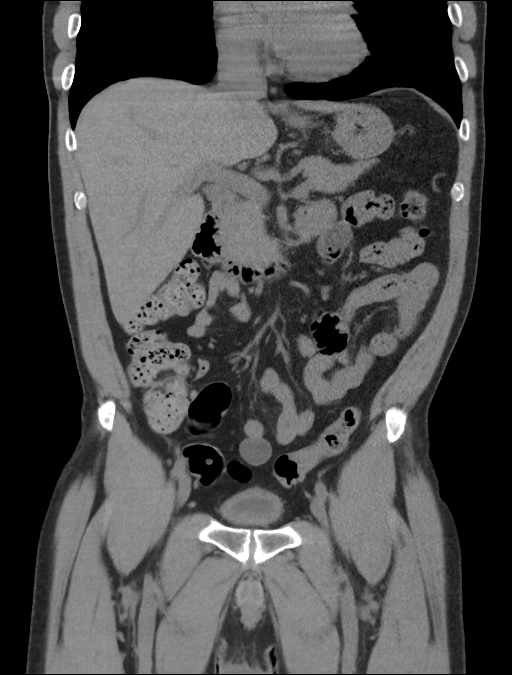
[im 60/108  soft-tissue]
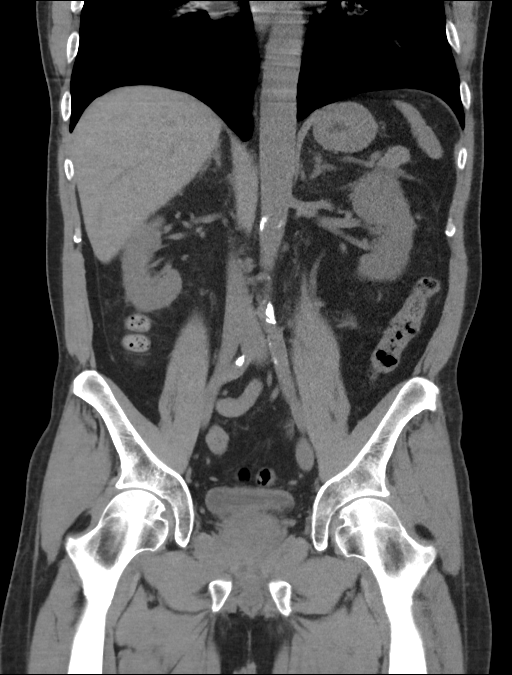

[16 of 46 positions shown; findings below may reference images not displayed]

FINDINGS: Lower chest: No acute abnormality.

Hepatobiliary: No focal liver abnormality is seen. No gallstones,
gallbladder wall thickening, or biliary dilatation.

Pancreas: Unremarkable. No pancreatic ductal dilatation or
surrounding inflammatory changes.

Spleen: Normal in size without focal abnormality.

Adrenals/Urinary Tract: Adrenal glands are unremarkable. Kidneys are
normal, without renal calculi, focal lesion, or hydronephrosis. Mild
diffuse urinary bladder wall thickening is noted.

Stomach/Bowel: Stomach is within normal limits. Appendix appears
normal. No evidence of bowel wall thickening, distention, or
inflammatory changes.

Vascular/Lymphatic: There is mild to moderate severity calcification
of the abdominal aorta and bilateral common iliac arteries. No
enlarged abdominal or pelvic lymph nodes.

Reproductive: The prostate gland is mildly enlarged.

Other: No abdominal wall hernia or abnormality. No abdominopelvic
ascites.

Musculoskeletal: No acute or significant osseous findings.
IMPRESSION: 1. Mild diffuse urinary bladder wall thickening which may represent
cystitis. Correlation with urinalysis is recommended.
2. Mild to moderate severity calcification of the abdominal aorta
and bilateral common iliac arteries.
3. Mildly enlarged prostate gland.

Aortic Atherosclerosis ([id number].I).

## 2021-04-02 ENCOUNTER — Ambulatory Visit (INDEPENDENT_AMBULATORY_CARE_PROVIDER_SITE_OTHER): Payer: No Typology Code available for payment source | Admitting: Internal Medicine

## 2021-06-10 ENCOUNTER — Other Ambulatory Visit (INDEPENDENT_AMBULATORY_CARE_PROVIDER_SITE_OTHER): Payer: Self-pay | Admitting: Nurse Practitioner

## 2021-06-10 DIAGNOSIS — I10 Essential (primary) hypertension: Secondary | ICD-10-CM

## 2021-06-18 ENCOUNTER — Ambulatory Visit: Payer: No Typology Code available for payment source | Admitting: Nurse Practitioner

## 2021-06-18 ENCOUNTER — Encounter: Payer: Self-pay | Admitting: Nurse Practitioner

## 2021-06-18 ENCOUNTER — Ambulatory Visit (INDEPENDENT_AMBULATORY_CARE_PROVIDER_SITE_OTHER): Payer: PRIVATE HEALTH INSURANCE | Admitting: Nurse Practitioner

## 2021-06-18 ENCOUNTER — Other Ambulatory Visit: Payer: Self-pay

## 2021-06-18 VITALS — BP 164/88 | HR 88 | Ht 71.0 in | Wt 174.0 lb

## 2021-06-18 DIAGNOSIS — I10 Essential (primary) hypertension: Secondary | ICD-10-CM

## 2021-06-18 DIAGNOSIS — E782 Mixed hyperlipidemia: Secondary | ICD-10-CM | POA: Diagnosis not present

## 2021-06-18 DIAGNOSIS — F1010 Alcohol abuse, uncomplicated: Secondary | ICD-10-CM

## 2021-06-18 DIAGNOSIS — E1100 Type 2 diabetes mellitus with hyperosmolarity without nonketotic hyperglycemic-hyperosmolar coma (NKHHC): Secondary | ICD-10-CM | POA: Diagnosis not present

## 2021-06-18 DIAGNOSIS — Z139 Encounter for screening, unspecified: Secondary | ICD-10-CM

## 2021-06-18 DIAGNOSIS — I2510 Atherosclerotic heart disease of native coronary artery without angina pectoris: Secondary | ICD-10-CM

## 2021-06-18 DIAGNOSIS — N183 Chronic kidney disease, stage 3 unspecified: Secondary | ICD-10-CM

## 2021-06-18 DIAGNOSIS — F1911 Other psychoactive substance abuse, in remission: Secondary | ICD-10-CM | POA: Insufficient documentation

## 2021-06-18 MED ORDER — LOSARTAN POTASSIUM 25 MG PO TABS: 25.0000 mg | ORAL_TABLET | Freq: Every day | ORAL | 3 refills | Status: DC

## 2021-06-18 MED ORDER — AMLODIPINE BESYLATE 10 MG PO TABS: 10.0000 mg | ORAL_TABLET | Freq: Every day | ORAL | 3 refills | Status: DC

## 2021-06-18 NOTE — Assessment & Plan Note (Signed)
Labs ordered today Last EGFR,.47 Cr 1.65 Avoid NSAID, drink plenty of water

## 2021-06-18 NOTE — Assessment & Plan Note (Signed)
Lab Results  Component Value Date   CHOL 195 01/22/2020   HDL 66 01/22/2020   LDLCALC 90 01/22/2020   TRIG 324 (H) 01/22/2020   CHOLHDL 3.0 01/22/2020  not on medication lipid panel ordered.

## 2021-06-18 NOTE — Progress Notes (Signed)
New Patient Office Visit  Subjective:  Patient ID: David Nicholson, male    DOB: [birth year]  Age: 61 y.o. MRN: [id number]  CC:  Chief Complaint  Patient presents with   New Patient (Initial Visit)    New pt. Previous PCP Dr. Anastasio Champion - No concerns    HPI David Nicholson presents to establish care. Previous pt of Dr Fredric Mare.  PT is due or his yarely annual exam, shingles, flu vaccine, TDAP vaccine, COVID booster vaccine. Due for colonscopy.  DM : Not on medication, GERD; Not taking medication ANXIETY: Denies anxiety or depression today, not on medications.  HLD: Not on mdications HTN. Takes amlodipine 10mg daily, lorsartan 25mg  . He has been out of med for some weeks now Substance abuse: used oxycodone, cocaine and marijuana, drinks alcohol 2-3 beer daily CKD Microalbuminuria/DM: DM is diet controlled, last urine level 739, not seeing nephrologist.     Past Medical History:  Diagnosis Date   Anxiety    CKD (chronic kidney disease) stage 3, GFR 30-59 ml/min (HCC) 10/15/2019   Diabetes mellitus without complication ([place]) 37/62/8315   GERD (gastroesophageal reflux disease)    Gout    HLD (hyperlipidemia) 10/15/2019   Hypertension    Substance abuse ([place])     Past Surgical History:  Procedure Laterality Date   COLONOSCOPY  06/30/2011   Procedure: COLONOSCOPY;  Surgeon: Daneil Dolin, MD;  [facility];  Service: Endoscopy;  Laterality: N/A;  7:30 AM    Family History  Problem Relation Age of Onset   Diabetes Mother    Hypertension Mother    Diabetes Sister    Hypertension Sister    Diabetes Brother    Hypertension Brother    Alzheimer's disease Father     Social History   Socioeconomic History   Marital status: Married    Spouse name: Not on file   Number of children: Not on file   Years of education: Not on file   Highest education level: Not on file  Occupational History   Not on file  Tobacco Use   Smoking status: Every Day    Packs/day:  0.25    Years: 48.00    Pack years: 12.00    Types: Cigarettes    Last attempt to quit: 02/2018    Years since quitting: 3.3   Smokeless tobacco: Never  Vaping Use   Vaping Use: Never used  Substance and Sexual Activity   Alcohol use: Yes    Comment: 2-3 beers every other day    Drug use: Not Currently    Types: Cocaine, Marijuana, Oxycodone    Comment: none since 02/2018.    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   Social Determinants of Health   Financial Resource Strain: Not on file  Food Insecurity: Not on file  Transportation Needs: Not on file  Physical Activity: Not on file  Stress: Not on file  Social Connections: Not on file  Intimate Partner Violence: Not on file    ROS Review of Systems  Constitutional: Negative.   Respiratory: Negative.    Cardiovascular: Negative.   Gastrointestinal: Negative.   Psychiatric/Behavioral: Negative.     Objective:   Today's Vitals: BP (!) 163/86 (BP Location: Right Arm, Patient Position: Sitting, Cuff Size: Normal)    Pulse 88  Ht 5\' 11"  (1.803 m)    Wt 174 lb (78.9 kg)    SpO2 95%    BMI 24.27 kg/m   Physical Exam Vitals and nursing note reviewed.  Constitutional:      General: He is not in acute distress.    Appearance: Normal appearance. He is not ill-appearing, toxic-appearing or diaphoretic.  Cardiovascular:     Rate and Rhythm: Normal rate and regular rhythm.     Pulses: Normal pulses.     Heart sounds: Normal heart sounds. No murmur heard.   No friction rub. No gallop.  Pulmonary:     Effort: No respiratory distress.     Breath sounds: No stridor. No wheezing, rhonchi or rales.  Chest:     Chest wall: No tenderness.  Abdominal:     Palpations: Abdomen is soft.  Neurological:     Mental Status: He is alert and oriented to person,  place, and time.     Cranial Nerves: No cranial nerve deficit.     Sensory: No sensory deficit.     Motor: No weakness.     Coordination: Coordination normal.     Gait: Gait normal.    Assessment & Plan:   Problem List Items Addressed This Visit   None   Outpatient Encounter Medications as of 06/18/2021  Medication Sig   acetaminophen (TYLENOL) 325 MG tablet Take 325 mg by mouth every 6 (six) hours as needed.   amLODipine (NORVASC) 10 MG tablet Take 1 tablet (10 mg total) by mouth daily.   ibuprofen (ADVIL) 600 MG tablet Take 600 mg by mouth every 6 (six) hours as needed.   losartan (COZAAR) 25 MG tablet Take 1 tablet (25 mg total) by mouth daily.   sildenafil (VIAGRA) 100 MG tablet Take 0.5 tablets (50 mg total) by mouth daily as needed for erectile dysfunction. (Patient not taking: Reported on 06/18/2021)   [DISCONTINUED] diclofenac Sodium (VOLTAREN) 1 % GEL Apply 2 g topically 4 (four) times daily.   [DISCONTINUED] omega-3 acid ethyl esters (LOVAZA) 1 g capsule Take 1 g by mouth daily.   No facility-administered encounter medications on file as of 06/18/2021.    Follow-up: No follow-ups on file.   Renee Rival, FNP

## 2021-06-18 NOTE — Patient Instructions (Signed)
Please get your shingles, TDAP, COVID booster,flu, pneumonia vaccine at your pharmacy   Please get your labs done 3-5 days before your next appointment.   It is important that you exercise regularly at least 30 minutes 5 times a week.  Think about what you will eat, plan ahead. Choose " clean, green, fresh or frozen" over canned, processed or packaged foods which are more sugary, salty and fatty. 70 to 75% of food eaten should be vegetables and fruit. Three meals at set times with snacks allowed between meals, but they must be fruit or vegetables. Aim to eat over a 12 hour period , example 7 am to 7 pm, and STOP after  your last meal of the day. Drink water,generally about 64 ounces per day, no other drink is as healthy. Fruit juice is best enjoyed in a healthy way, by EATING the fruit.  Thanks for choosing [facility], we consider it a privelige to serve you.

## 2021-06-18 NOTE — Assessment & Plan Note (Addendum)
Not on statin. lipid panel ordered today.Wil discuss use of statin next visit.

## 2021-06-18 NOTE — Assessment & Plan Note (Signed)
Lab Results  Component Value Date   HGBA1C 6.3 (H) 09/17/2020  A1c ordered Not on medications

## 2021-06-18 NOTE — Assessment & Plan Note (Signed)
States that  he quit smoking marijuana, cocaine, oxycodone. He continues to drink beer daily.

## 2021-06-18 NOTE — Assessment & Plan Note (Signed)
Drink 2-3 beer daily, pt counseled on the need to quit drinking, he verbalized understanding.

## 2021-06-18 NOTE — Assessment & Plan Note (Signed)
DASH diet and commitment to daily physical activity for a minimum of 30 minutes discussed and encouraged, as a part of hypertension management. The importance of attaining a healthy weight is also discussed.  BP/Weight 06/18/2021 12/24/2020 12/03/2020 11/20/2020 10/15/2020 09/17/2020 12/04/5282  Systolic BP 132 440 102 725 366 440 347  Diastolic BP 88 80 96 90 84 100 97  Wt. (Lbs) 174 172.8 170 178 175.2 175.2 170  BMI 24.27 24.1 23.71 24.83 24.44 24.44 23.71   Amlodipine 10mg  and losartan 25mg  refilled today. States that he has been out of medication for some weeks.

## 2021-06-19 LAB — CMP14+EGFR
ALT: 25 IU/L (ref 0–44)
AST: 21 IU/L (ref 0–40)
Albumin/Globulin Ratio: 1.7 (ref 1.2–2.2)
Albumin: 4.5 g/dL (ref 3.8–4.8)
Alkaline Phosphatase: 90 IU/L (ref 44–121)
BUN/Creatinine Ratio: 17 (ref 10–24)
BUN: 18 mg/dL (ref 8–27)
Bilirubin Total: <0.2 mg/dL (ref 0.0–1.2)
CO2: 20 mmol/L (ref 20–29)
Calcium: 9.8 mg/dL (ref 8.6–10.2)
Chloride: 102 mmol/L (ref 96–106)
Creatinine, Ser: 1.03 mg/dL (ref 0.76–1.27)
Globulin, Total: 2.6 g/dL (ref 1.5–4.5)
Glucose: 135 mg/dL — ABNORMAL HIGH (ref 70–99)
Potassium: 4 mmol/L (ref 3.5–5.2)
Sodium: 142 mmol/L (ref 134–144)
Total Protein: 7.1 g/dL (ref 6.0–8.5)
eGFR: 82 mL/min/{1.73_m2} (ref >59)

## 2021-07-20 ENCOUNTER — Encounter: Payer: Self-pay | Admitting: Nurse Practitioner

## 2021-07-20 ENCOUNTER — Ambulatory Visit: Payer: PRIVATE HEALTH INSURANCE

## 2021-07-20 ENCOUNTER — Other Ambulatory Visit: Payer: Self-pay

## 2021-07-20 ENCOUNTER — Ambulatory Visit (INDEPENDENT_AMBULATORY_CARE_PROVIDER_SITE_OTHER): Payer: PRIVATE HEALTH INSURANCE | Admitting: Nurse Practitioner

## 2021-07-20 DIAGNOSIS — J029 Acute pharyngitis, unspecified: Secondary | ICD-10-CM | POA: Diagnosis not present

## 2021-07-20 DIAGNOSIS — T7840XA Allergy, unspecified, initial encounter: Secondary | ICD-10-CM

## 2021-07-20 DIAGNOSIS — R051 Acute cough: Secondary | ICD-10-CM

## 2021-07-20 LAB — POCT RAPID STREP A (OFFICE): Rapid Strep A Screen: NEGATIVE

## 2021-07-20 LAB — POCT INFLUENZA A/B
Influenza A, POC: NEGATIVE
Influenza B, POC: NEGATIVE

## 2021-07-20 MED ORDER — CETIRIZINE HCL 10 MG PO TABS: 10.0000 mg | ORAL_TABLET | Freq: Every day | ORAL | 1 refills | Status: DC

## 2021-07-20 MED ORDER — FLUTICASONE PROPIONATE 50 MCG/ACT NA SUSP: 2.0000 | Freq: Every day | NASAL | 2 refills | Status: DC

## 2021-07-20 MED ORDER — BENZONATATE 100 MG PO CAPS: 100.0000 mg | ORAL_CAPSULE | Freq: Two times a day (BID) | ORAL | 0 refills | Status: DC | PRN

## 2021-07-20 NOTE — Progress Notes (Signed)
Please review results with patient. COVID flu strep negative.  Patient should take Tessalon 100 mg 2 times daily as needed for cough.  Zyrtec 10 mg daily and flonase nasal spray , 2 spray daily for allergies Take Tylenol as needed for his sore throat.   Call back to the office if his symptoms does not get better after taking these medications for a week.

## 2021-07-20 NOTE — Assessment & Plan Note (Signed)
Takes Zyrtec 10 mg daily Flonase nasal spray 2 sprays daily

## 2021-07-20 NOTE — Assessment & Plan Note (Signed)
Will swab for strep, patient told to use Tylenol as needed for throat pain, drink warm fluids.

## 2021-07-20 NOTE — Progress Notes (Signed)
Virtual Visit via Telephone Note  I connected with [name] on 07/20/21 at 1129 by telephone and verified that I am speaking with the correct person using two identifiers.  I spent 8 minutes talking to the patient and reviewing his chart  Location: Patient: home Provider: office   I discussed the limitations, risks, security and privacy concerns of performing an evaluation and management service by telephone and the availability of in person appointments. I also discussed with the patient that there may be a patient responsible charge related to this service. The patient expressed understanding and agreed to proceed.   History of Present Illness:   pt c/o sore throat, watery eyes, feeling weak,  body aches since 3 days ago. Has greenish colored sputum, some sneezing, running nose, HA 2/10. Pt denies, wheezing, sob, CP, palpitation. did not get covid booster and flu vaccines, denies recent  known sick contact. Has used some OTC. cold and cough med but his symptoms did not get better. Pt is a current smoker Observations/Objective:   Assessment and Plan: Cough.  Tessalon 100 mg twice daily as needed. Pt will come to the office to  Check for COVID and flu. Pt educated on smoking cessation he verbalized understanding.  Allergies.  Zyrtec 10 mg daily, Flonase nasal spray Sore throat.  Will swab for strep, patient told to use Tylenol as needed for throat pain, drink warm fluids.  Patient encouraged to get his flu vaccine and COVID booster once he gets better,  Follow Up Instructions:    I discussed the assessment and treatment plan with the patient. The patient was provided an opportunity to ask questions and all were answered. The patient agreed with the plan and demonstrated an understanding of the instructions.   The patient was advised to call back or seek an in-person evaluation if the symptoms worsen or if the condition fails to improve as anticipated.

## 2021-07-20 NOTE — Addendum Note (Signed)
Addended by: Quentin Angst on: 07/20/2021 12:18 PM   Modules accepted: Orders

## 2021-07-20 NOTE — Assessment & Plan Note (Signed)
Tessalon 100 mg twice daily as needed. Patient will come to the office for COVID and flu test.

## 2021-07-22 ENCOUNTER — Telehealth: Payer: Self-pay | Admitting: Nurse Practitioner

## 2021-07-22 ENCOUNTER — Other Ambulatory Visit: Payer: Self-pay | Admitting: Nurse Practitioner

## 2021-07-22 DIAGNOSIS — J029 Acute pharyngitis, unspecified: Secondary | ICD-10-CM

## 2021-07-22 LAB — NOVEL CORONAVIRUS, NAA: SARS-CoV-2, NAA: NOT DETECTED

## 2021-07-22 MED ORDER — AZITHROMYCIN 250 MG PO TABS: ORAL_TABLET | ORAL | 0 refills | Status: AC

## 2021-07-22 NOTE — Telephone Encounter (Signed)
Spoke with pt results given

## 2021-07-22 NOTE — Telephone Encounter (Signed)
Pt is calling  you back --please call

## 2021-07-23 ENCOUNTER — Telehealth: Payer: Self-pay | Admitting: Nurse Practitioner

## 2021-07-23 NOTE — Telephone Encounter (Signed)
Pt states he needs a note for work, he has been out of work for this week.

## 2021-07-23 NOTE — Telephone Encounter (Signed)
Work note printed and up front for patient to pick up unable to reach patient by phone mychart message sent letting him know.

## 2021-09-16 ENCOUNTER — Ambulatory Visit (INDEPENDENT_AMBULATORY_CARE_PROVIDER_SITE_OTHER): Payer: PRIVATE HEALTH INSURANCE | Admitting: Nurse Practitioner

## 2021-09-16 ENCOUNTER — Encounter: Payer: Self-pay | Admitting: Nurse Practitioner

## 2021-09-16 VITALS — BP 145/88 | HR 80 | Ht 72.0 in | Wt 171.0 lb

## 2021-09-16 DIAGNOSIS — I10 Essential (primary) hypertension: Secondary | ICD-10-CM | POA: Diagnosis not present

## 2021-09-16 DIAGNOSIS — Z0001 Encounter for general adult medical examination with abnormal findings: Secondary | ICD-10-CM | POA: Diagnosis not present

## 2021-09-16 DIAGNOSIS — Z1211 Encounter for screening for malignant neoplasm of colon: Secondary | ICD-10-CM

## 2021-09-16 DIAGNOSIS — H543 Unqualified visual loss, both eyes: Secondary | ICD-10-CM | POA: Diagnosis not present

## 2021-09-16 DIAGNOSIS — K0889 Other specified disorders of teeth and supporting structures: Secondary | ICD-10-CM

## 2021-09-16 DIAGNOSIS — Z0000 Encounter for general adult medical examination without abnormal findings: Secondary | ICD-10-CM

## 2021-09-16 MED ORDER — AMLODIPINE BESYLATE 10 MG PO TABS: 10.0000 mg | ORAL_TABLET | Freq: Every day | ORAL | 3 refills | Status: DC

## 2021-09-16 MED ORDER — LOSARTAN POTASSIUM 50 MG PO TABS: 50.0000 mg | ORAL_TABLET | Freq: Every day | ORAL | 0 refills | Status: DC

## 2021-09-16 NOTE — Assessment & Plan Note (Signed)
Annual exam as documented.  ?Counseling done include healthy lifestyle involving committing to 150 minutes of exercise per week, heart healthy diet, and attaining healthy weight. The importance of adequate sleep also discussed.  ?Regular use of seat belt and home safety were also discussed . ?Changes in health habits are decided on by patient with goals and time frames set for achieving them. ?Immunization and cancer screening  needs are specifically addressed at this visit.   ?Screening colonoscopy ordered today, patient encouraged to get his Tdap and shingles vaccines ?

## 2021-09-16 NOTE — Patient Instructions (Addendum)
Please start taking losartan '50mg'$  daily, continue amlodipine '10mg'$  daily.  ?Please get your shingles vaccines, Tdap vaccine at the pharmacy ? ? ? ?It is important that you exercise regularly at least 30 minutes 5 times a week.  ?Think about what you will eat, plan ahead. ?Choose " clean, green, fresh or frozen" over canned, processed or packaged foods which are more sugary, salty and fatty. ?70 to 75% of food eaten should be vegetables and fruit. ?Three meals at set times with snacks allowed between meals, but they must be fruit or vegetables. ?Aim to eat over a 12 hour period , example 7 am to 7 pm, and STOP after  your last meal of the day. ?Drink water,generally about 64 ounces per day, no other drink is as healthy. Fruit juice is best enjoyed in a healthy way, by EATING the fruit. ? ?Thanks for choosing [facility], we consider it a privelige to serve you.  ?

## 2021-09-16 NOTE — Assessment & Plan Note (Signed)
?  Left eye 20/70 right eye 20/30 both eyes 20/30 wears reading glasses ?Referral sent to ophthalmology ?

## 2021-09-16 NOTE — Progress Notes (Signed)
? ?New Patient Office Visit ? ?Subjective:  ?Patient ID: David Nicholson, male    DOB: 07-27-1960  Age: 61 y.o. MRN: [id number] ? ?CC:  ?Chief Complaint  ?Patient presents with  ? Annual Exam  ?  CPE  ? ? ?HPI ?[name] weight past medical history of hypertension, CAD, diabetes, CKD, presents for annual physical. ? ?Patient continues to smokes half a pack of cigarettes daily for over 48 years, no ready to quit today , smoking cessation education completed.  ? ?Patient complaining of lower right sided chronic toothache, states that his pain is worse when he eats sugary foods, he was supposed to have his tooth pulled in the past but he had no insurance at that time would like a referral to dentist. ? ?Due for shingles and Tdap vaccines patient encouraged to get both vaccines at his pharmacy he verbalized understanding. ? ?Referral sent for screening colonoscopy, diabetic foot exam completed today. ? ?Past Medical History:  ?Diagnosis Date  ? Anxiety   ? CKD (chronic kidney disease) stage 3, GFR 30-59 ml/min (HCC) 10/15/2019  ? Diabetes mellitus without complication ([place]) 12/01/9483  ? GERD (gastroesophageal reflux disease)   ? Gout   ? HLD (hyperlipidemia) 10/15/2019  ? Hypertension   ? Substance abuse ([place])   ? ? ?Past Surgical History:  ?Procedure Laterality Date  ? COLONOSCOPY  06/30/2011  ? Procedure: COLONOSCOPY;  Surgeon: Daneil Dolin, MD;  Location: [facility];  Service: Endoscopy;  Laterality: N/A;  7:30 AM  ? ? ?Family History  ?Problem Relation Age of Onset  ? Diabetes Mother   ? Hypertension Mother   ? Alzheimer's disease Father   ? Diabetes Sister   ? Hypertension Sister   ? Diabetes Brother   ? Hypertension Brother   ? Lung cancer Neg Hx   ? Prostate cancer Neg Hx   ? Cancer - Colon Neg Hx   ? ? ?Social History  ? ?Socioeconomic History  ? Marital status: Single  ?  Spouse name: Not on file  ? Number of children: 1  ? Years of education: Not on file  ? Highest education level: Not on file   ?Occupational History  ? Not on file  ?Tobacco Use  ? Smoking status: Some Days  ?  Packs/day: 0.25  ?  Years: 48.00  ?  Pack years: 12.00  ?  Types: Cigarettes  ? Smokeless tobacco: Never  ? Tobacco comments:  ?  Pt stated the he smoked less than a pack daily off and on for 20 years. He quit in 2022.  ?Vaping Use  ? Vaping Use: Never used  ?Substance and Sexual Activity  ? Alcohol use: Yes  ?  Comment: 2-3 beers every other day   ? Drug use: Not Currently  ?  Types: Cocaine, Marijuana, Oxycodone  ?  Comment: none since 02/2018.   ? Sexual activity: Yes  ?  Comment: has one partner  ?Other Topics Concern  ? Not on file  ?Social History Narrative  ? Single,common-law wife of 18 years.Lives with common-law wife. Works at [employer]    ? ?Social Determinants of Health  ? ?Financial Resource Strain: Not on file  ?Food Insecurity: Not on file  ?Transportation Needs: Not on file  ?Physical Activity: Not on file  ?Stress: Not on file  ?Social Connections: Not on file  ?Intimate Partner Violence: Not on file  ? ? ?ROS ?Review of Systems  ?Constitutional: Negative.   ?HENT:  Positive for dental problem.  Negative for drooling, ear discharge, ear pain, facial swelling, hearing loss, mouth sores, nosebleeds, postnasal drip, rhinorrhea, sinus pressure, sinus pain, sneezing, sore throat, tinnitus, trouble swallowing and voice change.   ?Eyes: Negative.   ?Respiratory: Negative.    ?Cardiovascular: Negative.   ?Gastrointestinal: Negative.   ?Endocrine: Negative.   ?Genitourinary: Negative.   ?Musculoskeletal: Negative.   ?Skin: Negative.   ?Allergic/Immunologic: Negative.   ?Neurological: Negative.   ?Hematological: Negative.   ?Psychiatric/Behavioral: Negative.    ? ?Objective:  ? ?Today's Vitals: BP (!) 145/88   Pulse 80   Ht 6' (1.829 m)   Wt 171 lb (77.6 kg)   SpO2 100%   BMI 23.19 kg/m?  ? ?Physical Exam ?Constitutional:   ?   General: He is not in acute distress. ?   Appearance: Normal appearance. He is not ill-appearing,  toxic-appearing or diaphoretic.  ?HENT:  ?   Head: Normocephalic.  ?   Right Ear: Tympanic membrane, ear canal and external ear normal. There is no impacted cerumen.  ?   Left Ear: Tympanic membrane, ear canal and external ear normal. There is no impacted cerumen.  ?   Nose: No congestion or rhinorrhea.  ?   Mouth/Throat:  ?   Mouth: Mucous membranes are moist.  ?   Dentition: Does not have dentures. Dental caries present. No dental tenderness, gingival swelling, dental abscesses or gum lesions.  ?   Tongue: No lesions. Tongue does not deviate from midline.  ?   Pharynx: Oropharynx is clear. No oropharyngeal exudate or posterior oropharyngeal erythema.  ?   Comments: Dental caries On right lower teeth ?Eyes:  ?   General: No scleral icterus.    ?   Right eye: No discharge.     ?   Left eye: No discharge.  ?   Extraocular Movements: Extraocular movements intact.  ?   Conjunctiva/sclera: Conjunctivae normal.  ?   Pupils: Pupils are equal, round, and reactive to light.  ?Neck:  ?   Vascular: No carotid bruit.  ?Cardiovascular:  ?   Rate and Rhythm: Normal rate and regular rhythm.  ?   Pulses: Normal pulses.  ?   Heart sounds: Normal heart sounds. No murmur heard. ?  No friction rub. No gallop.  ?Pulmonary:  ?   Effort: Pulmonary effort is normal. No respiratory distress.  ?   Breath sounds: Normal breath sounds. No stridor. No wheezing, rhonchi or rales.  ?Chest:  ?   Chest wall: No tenderness.  ?Abdominal:  ?   General: There is no distension.  ?   Palpations: Abdomen is soft. There is no mass.  ?   Tenderness: There is no abdominal tenderness. There is no right CVA tenderness, left CVA tenderness or guarding.  ?   Hernia: No hernia is present.  ?Musculoskeletal:     ?   General: No swelling, tenderness, deformity or signs of injury. Normal range of motion.  ?   Cervical back: No rigidity or tenderness.  ?   Right lower leg: No edema.  ?   Left lower leg: No edema.  ?Lymphadenopathy:  ?   Cervical: No cervical  adenopathy.  ?Skin: ?   General: Skin is warm.  ?   Capillary Refill: Capillary refill takes less than 2 seconds.  ?   Coloration: Skin is not jaundiced or pale.  ?   Findings: No bruising, erythema, lesion or rash.  ?Neurological:  ?   Mental Status: He is alert and oriented to person,  place, and time.  ?   Cranial Nerves: No cranial nerve deficit.  ?   Sensory: No sensory deficit.  ?   Motor: No weakness.  ?   Coordination: Coordination normal.  ?   Gait: Gait normal.  ?   Deep Tendon Reflexes: Reflexes normal.  ?Psychiatric:     ?   Mood and Affect: Mood normal.     ?   Behavior: Behavior normal.     ?   Thought Content: Thought content normal.     ?   Judgment: Judgment normal.  ? ? ?Assessment & Plan:  ? ?Problem List Items Addressed This Visit   ? ?  ? Cardiovascular and Mediastinum  ? Hypertension  ?  BP Readings from Last 3 Encounters:  ?09/16/21 (!) 145/88  ?06/18/21 (!) 164/88  ?12/24/20 (!) 146/80  ?aking amlodipine '10mg'$  daily, losartan '25mg'$  daily.  ?Blood pressure uncontrolled, start taking losartan 50 mg daily continue amlodipine 10 mg daily ?Medications refilled ?DASH diet advised engage in regular exercises at least 150 minutes weekly ?Monitor blood pressure at home ?Follow-up in 4 weeks ?  ?  ? Relevant Medications  ? losartan (COZAAR) 50 MG tablet  ? amLODipine (NORVASC) 10 MG tablet  ?  ? Other  ? Pain in tooth  ?  No sign of infection noted, take OTC Tylenol as needed, avoid sugar sweets candy ?Tooth pain ongoing for there years, was supposed to get tooth pulled but had no insurance then ?referral to dentist today  ?  ?  ? Relevant Orders  ? Ambulatory referral to Dentistry  ? Annual physical exam  ?  Annual exam as documented.  ?Counseling done include healthy lifestyle involving committing to 150 minutes of exercise per week, heart healthy diet, and attaining healthy weight. The importance of adequate sleep also discussed.  ?Regular use of seat belt and home safety were also discussed . ?Changes  in health habits are decided on by patient with goals and time frames set for achieving them. ?Immunization and cancer screening  needs are specifically addressed at this visit.   ?Screening colonoscopy ordered today,

## 2021-09-16 NOTE — Assessment & Plan Note (Addendum)
BP Readings from Last 3 Encounters:  ?09/16/21 (!) 145/88  ?06/18/21 (!) 164/88  ?12/24/20 (!) 146/80  ?aking amlodipine '10mg'$  daily, losartan '25mg'$  daily.  ?Blood pressure uncontrolled, start taking losartan 50 mg daily continue amlodipine 10 mg daily ?Medications refilled ?DASH diet advised engage in regular exercises at least 150 minutes weekly ?Monitor blood pressure at home ?Follow-up in 4 weeks ?

## 2021-09-16 NOTE — Assessment & Plan Note (Addendum)
No sign of infection noted, take OTC Tylenol as needed, avoid sugar sweets candy ?Tooth pain ongoing for there years, was supposed to get tooth pulled but had no insurance then ?referral to dentist today  ?

## 2021-09-17 ENCOUNTER — Encounter: Payer: Self-pay | Admitting: Internal Medicine

## 2021-09-17 IMAGING — DX DG FOOT COMPLETE 3+V*L*
3 series · 3 of 3 positions shown · non-contrast
Comparison: No prior.

CLINICAL DATA: Left foot injury.

EXAM:
LEFT FOOT - COMPLETE 3+ VIEW

[foot ap]
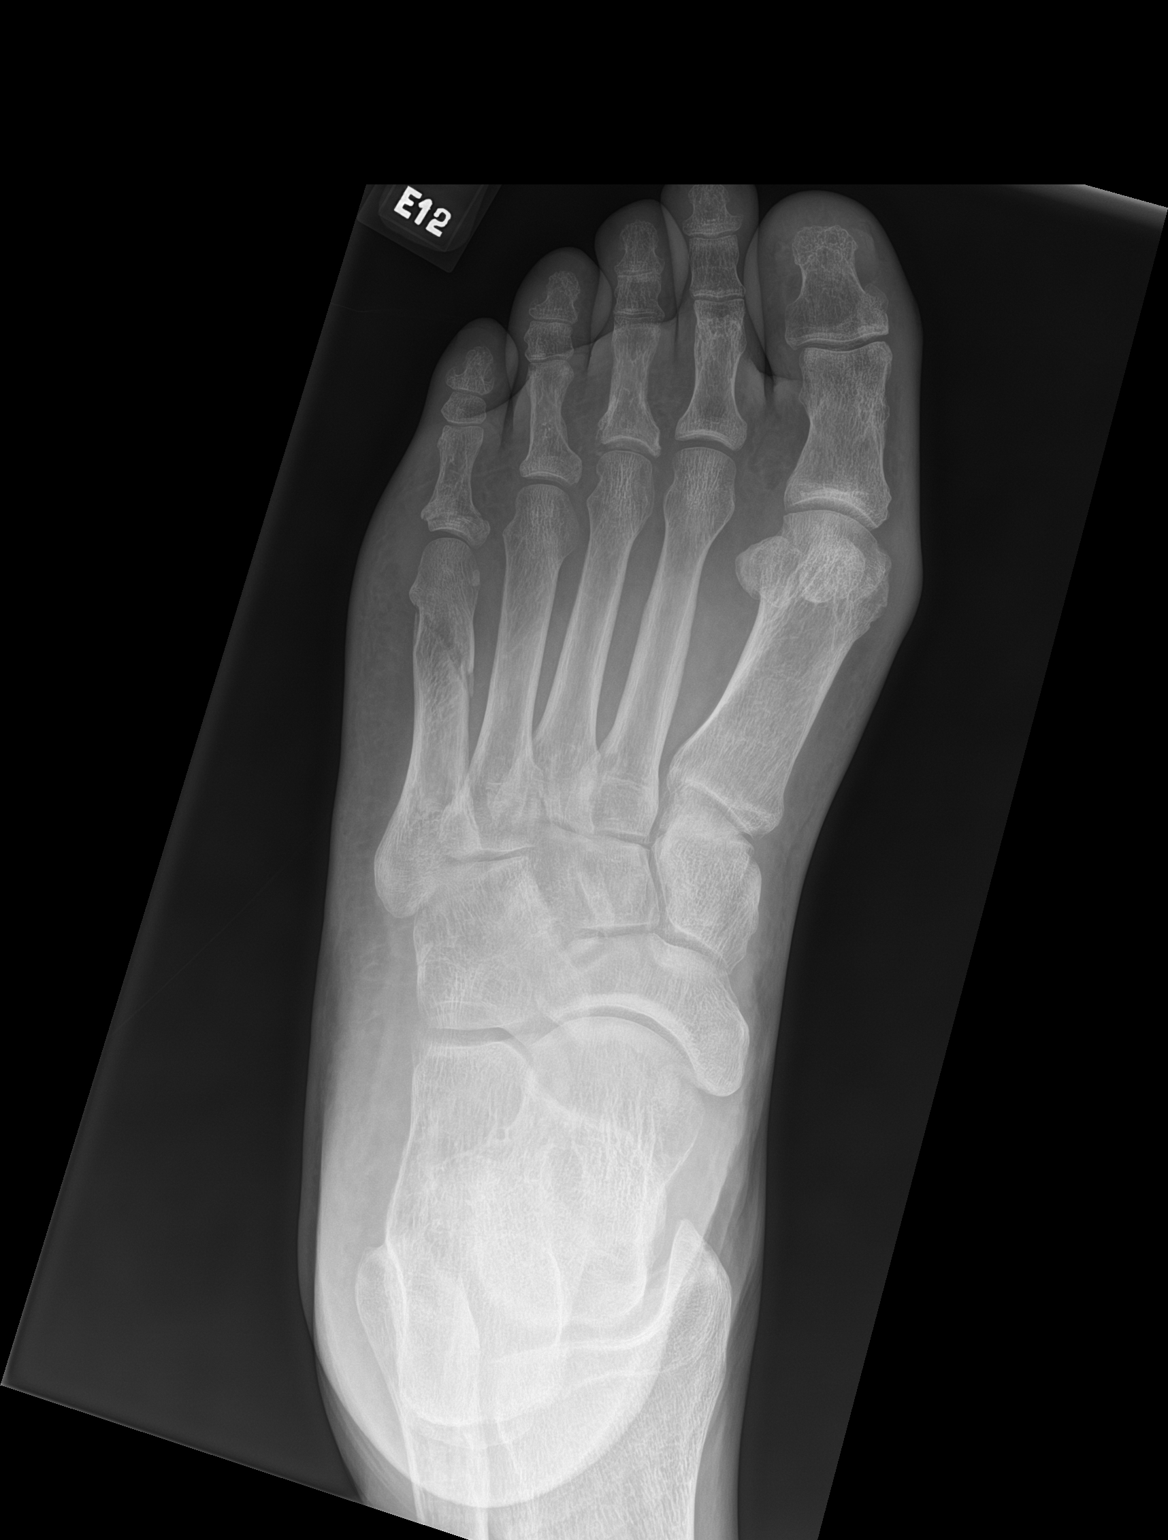

[foot obl]
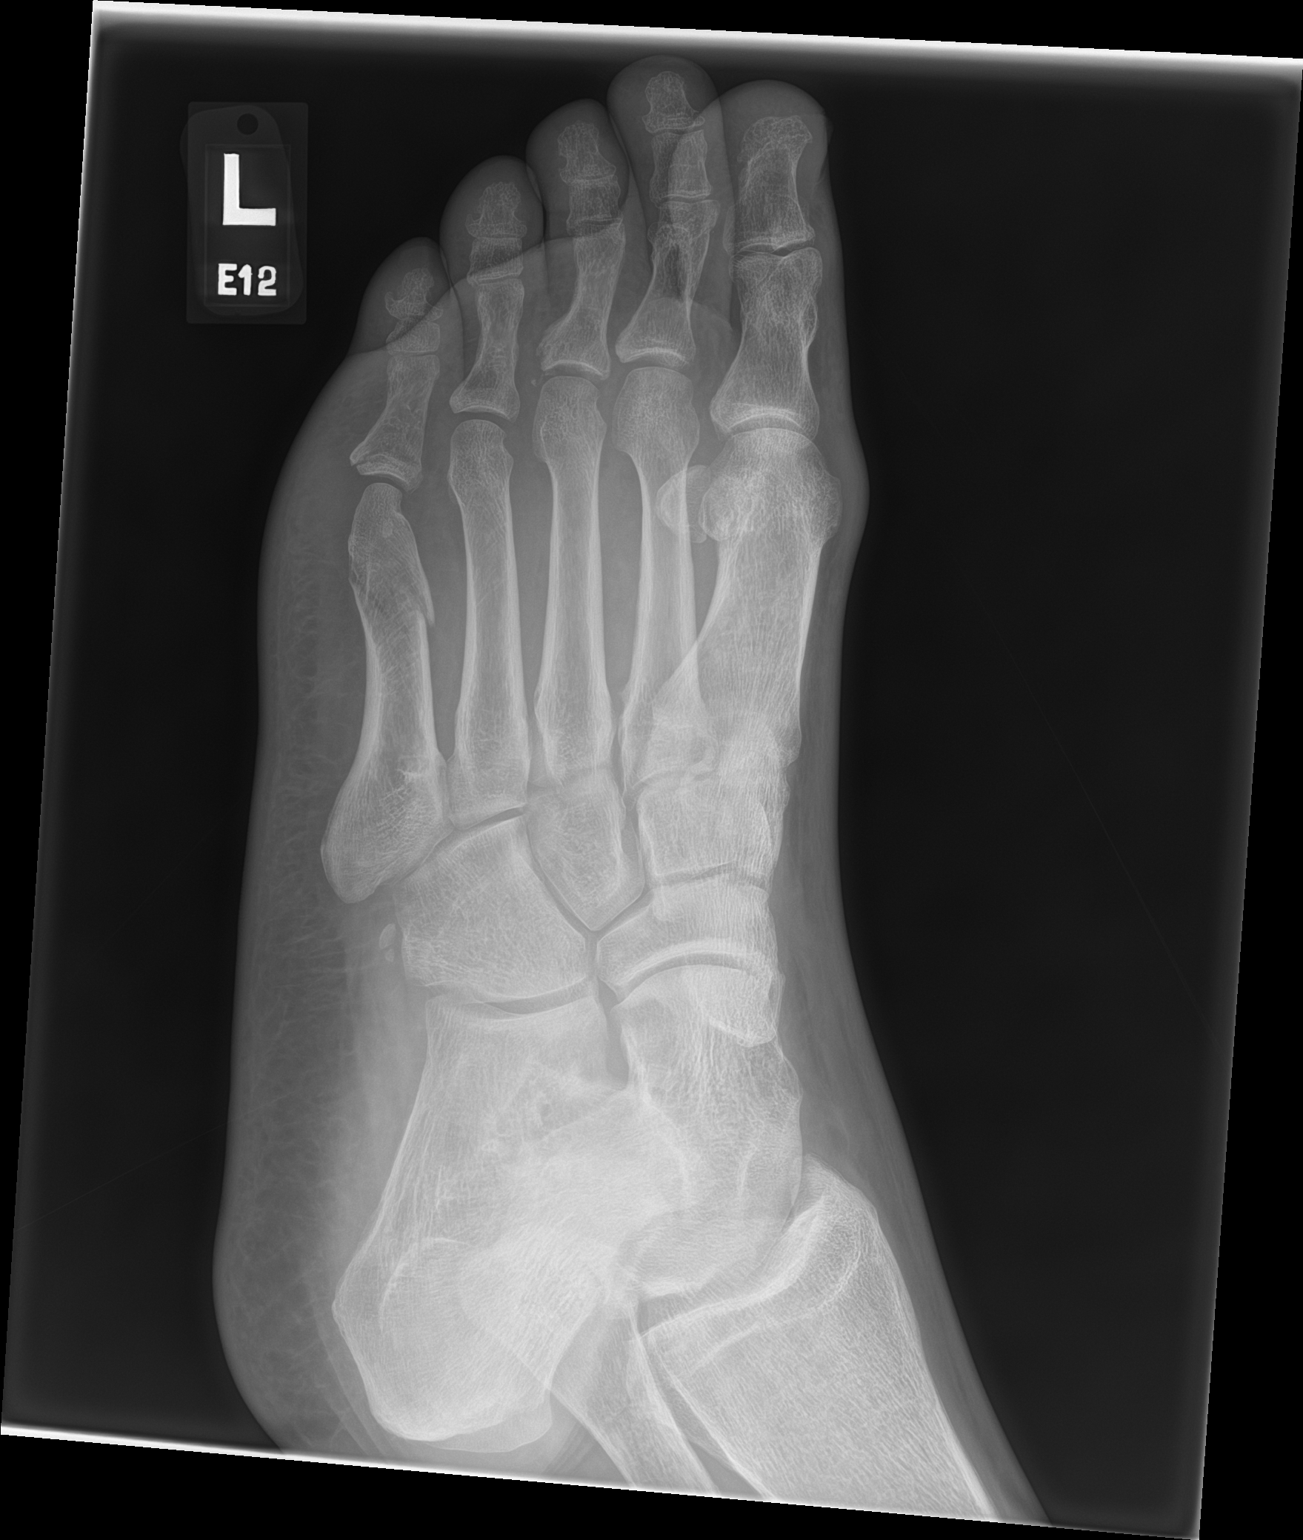

[foot lat]
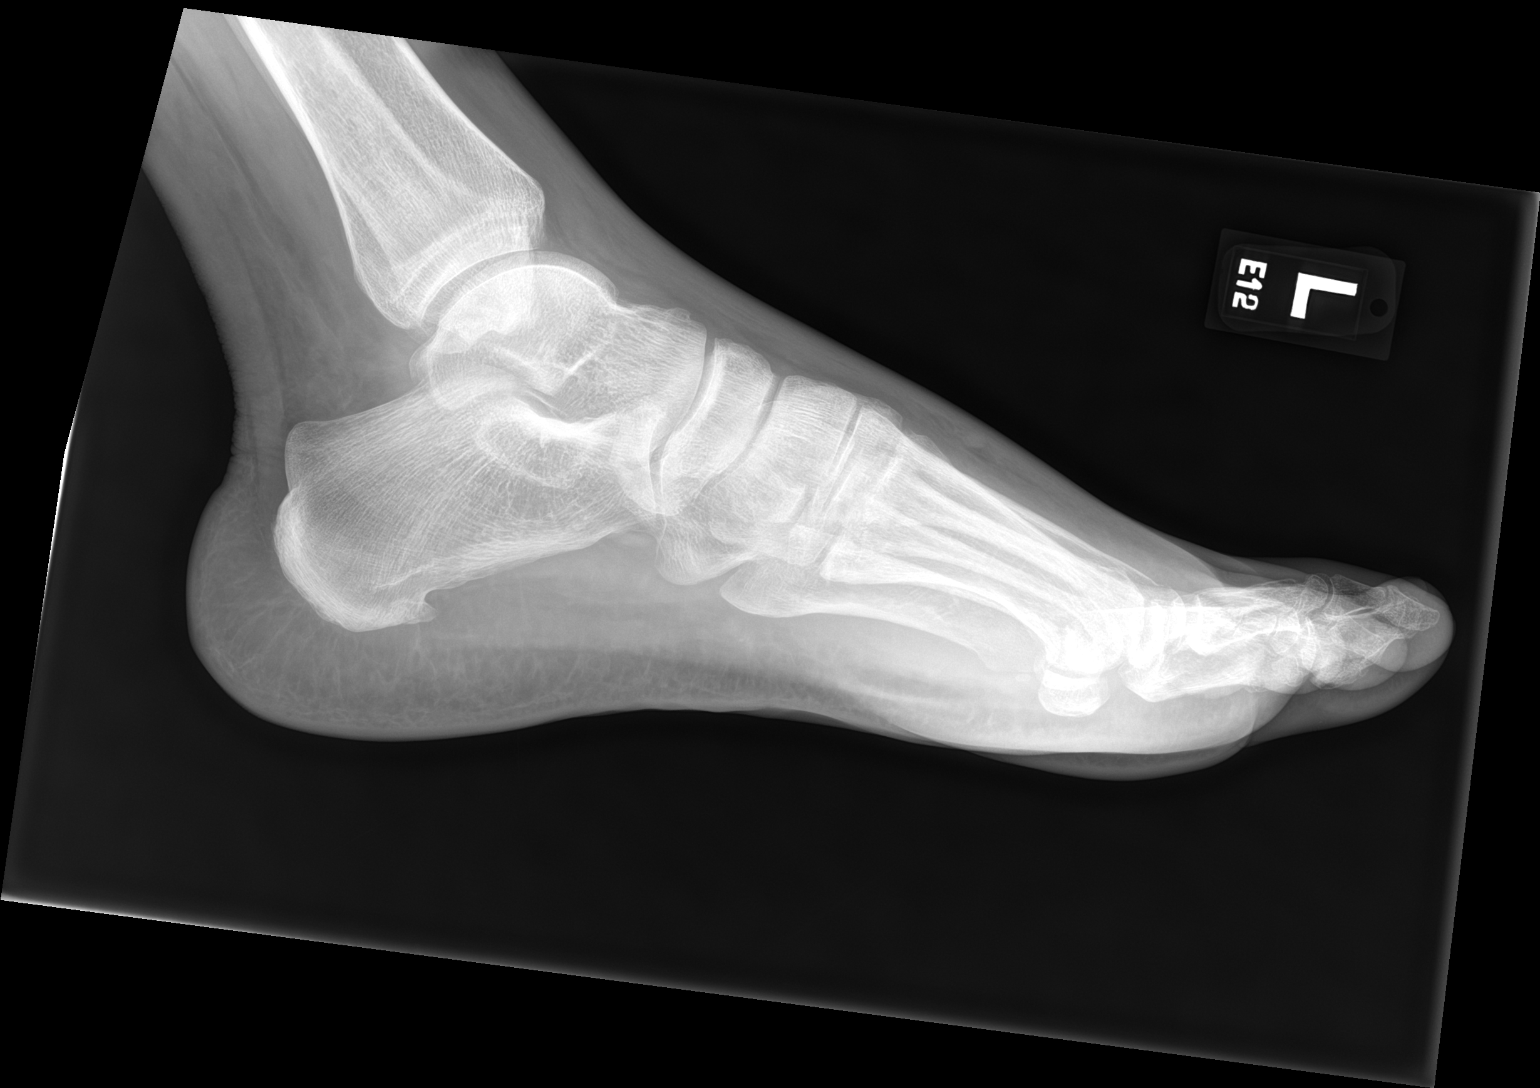

[3 of 3 positions shown; findings below may reference images not displayed]

FINDINGS: Slightly displaced oblique fracture of the distal aspect of the left
fifth metatarsal. Slight displaced fracture of the base of the
proximal phalanx of the left fifth digit. No evidence of
dislocation. No radiopaque foreign body.
IMPRESSION: Slightly displaced fractures of the distal aspect of the left fifth
metatarsal and base of the proximal phalanx of the left fifth digit.

## 2021-09-21 ENCOUNTER — Encounter: Payer: Self-pay | Admitting: Nurse Practitioner

## 2021-10-21 ENCOUNTER — Ambulatory Visit (INDEPENDENT_AMBULATORY_CARE_PROVIDER_SITE_OTHER): Payer: PRIVATE HEALTH INSURANCE | Admitting: Nurse Practitioner

## 2021-10-21 ENCOUNTER — Encounter: Payer: Self-pay | Admitting: Nurse Practitioner

## 2021-10-21 DIAGNOSIS — F1010 Alcohol abuse, uncomplicated: Secondary | ICD-10-CM | POA: Diagnosis not present

## 2021-10-21 DIAGNOSIS — I10 Essential (primary) hypertension: Secondary | ICD-10-CM

## 2021-10-21 DIAGNOSIS — F17200 Nicotine dependence, unspecified, uncomplicated: Secondary | ICD-10-CM | POA: Diagnosis not present

## 2021-10-21 MED ORDER — LOSARTAN POTASSIUM 50 MG PO TABS: 50.0000 mg | ORAL_TABLET | Freq: Every day | ORAL | 1 refills | Status: DC

## 2021-10-21 MED ORDER — AMLODIPINE BESYLATE 10 MG PO TABS: 10.0000 mg | ORAL_TABLET | Freq: Every day | ORAL | 1 refills | Status: DC

## 2021-10-21 NOTE — Patient Instructions (Signed)
Please get your TDAP vaccine , shingles vaccine at your pharmacy ? ?It is important that you exercise regularly at least 30 minutes 5 times a week.  ?Think about what you will eat, plan ahead. ?Choose " clean, green, fresh or frozen" over canned, processed or packaged foods which are more sugary, salty and fatty. ?70 to 75% of food eaten should be vegetables and fruit. ?Three meals at set times with snacks allowed between meals, but they must be fruit or vegetables. ?Aim to eat over a 12 hour period , example 7 am to 7 pm, and STOP after  your last meal of the day. ?Drink water,generally about 64 ounces per day, no other drink is as healthy. Fruit juice is best enjoyed in a healthy way, by EATING the fruit. ? ?Thanks for choosing [facility], we consider it a privelige to serve you.  ?

## 2021-10-21 NOTE — Progress Notes (Signed)
? ?  David Nicholson     MRN: [id number]      DOB: 08-31-1960 ? ? ?HPI ?David Nicholson with past medical history of hypertension diabetes, hyperlipidemia, alcohol abuse, is here for follow up for hypertension.  During the last visit blood pressure was elevated, patient was told to start taking losartan 50 mg daily with amlodipine 10 mg daily, patient states that his blood pressure has been well controlled at home he did not bring his blood pressure log, stated that he has called at home smoking and drinking as well.  Patient denies dizziness, syncope, chest pain, edema. ? ? ? ?Current everyday smoker. He has been smoking since age 62,, states that he has always smoked about half a pack daily , since 4 weeks ago he has started smoking 4 cigarettes daily.  ? ?He has cut down on drinking alcohol, now drinking 2 can of beers in 4 days , states that hew used to drink more than that before.  ? ? ?ROS ?Denies recent fever or chills. ?Denies sinus pressure, nasal congestion, ear pain or sore throat. ?Denies chest congestion, productive cough or wheezing. ?Denies chest pains, palpitations and leg swelling ?Denies abdominal pain, nausea, vomiting,diarrhea or constipation.   ?Denies dysuria, frequency, hesitancy or incontinence. ?Denies depression, anxiety or insomnia. ? ? ?PE ? ?BP 136/83 (BP Location: Left Arm, Patient Position: Sitting, Cuff Size: Large)   Pulse 76   Ht '5\' 11"'$  (1.803 m)   Wt 172 lb (78 kg)   SpO2 100%   BMI 23.99 kg/m?  ? ?Patient alert and oriented and in no cardiopulmonary distress. ? ? ?Chest: Clear to auscultation bilaterally. ? ?CVS: S1, S2 no murmurs, no S3.Regular rate. ? ?ABD: Soft non tender.  ? ?Ext: No edema ? ?MS: Adequate ROM spine, shoulders, hips and knees. ? ? ?Psych: Good eye contact, normal affect. Memory intact not anxious or depressed appearing. ?noted. ? ? ?Assessment & Plan ? ?Alcohol abuse ?He has cut down on drinking alcohol, now drinking 2 can of beers in 4 days , states that hew  used to drink more than that before.  ?Benefits of abstaining from alcohol discussed with patient patient verbalized understanding ? ?Current every day smoker ?Current everyday smoker. He has been smoking since age 12,, states that he has always smoked about half a pack daily , since 4 weeks ago he has started smoking 4 cigarettes daily.  ?Need to quit smoking including risk of lung cancer, COPD, MI, stroke discussed with patient he verbalized understanding.  Patient stated that he will continue to work back on quitting, refused medications for smoking cessation today ?Follow-up in 6 months ? ?Hypertension ?BP Readings from Last 3 Encounters:  ?10/21/21 136/83  ?09/16/21 (!) 145/88  ?06/18/21 (!) 164/88  ?Chronic condition well-controlled on amlodipine 10 mg daily, losartan 50 mg daily ?Continue current medications ?DASH diet advised engage in exercise at least 150 minutes weekly ?CMP today ?Follow-up in 6 months  ?

## 2021-10-21 NOTE — Assessment & Plan Note (Signed)
Current everyday smoker. He has been smoking since age 61,, states that he has always smoked about half a pack daily , since 4 weeks ago he has started smoking 4 cigarettes daily.  ?Need to quit smoking including risk of lung cancer, COPD, MI, stroke discussed with patient he verbalized understanding.  Patient stated that he will continue to work back on quitting, refused medications for smoking cessation today ?Follow-up in 6 months ?

## 2021-10-21 NOTE — Assessment & Plan Note (Signed)
BP Readings from Last 3 Encounters:  ?10/21/21 136/83  ?09/16/21 (!) 145/88  ?06/18/21 (!) 164/88  ?Chronic condition well-controlled on amlodipine 10 mg daily, losartan 50 mg daily ?Continue current medications ?DASH diet advised engage in exercise at least 150 minutes weekly ?CMP today ?Follow-up in 6 months ?

## 2021-10-21 NOTE — Assessment & Plan Note (Signed)
He has cut down on drinking alcohol, now drinking 2 can of beers in 4 days , states that hew used to drink more than that before.  ?Benefits of abstaining from alcohol discussed with patient patient verbalized understanding ?

## 2021-10-22 ENCOUNTER — Other Ambulatory Visit: Payer: Self-pay | Admitting: Nurse Practitioner

## 2021-10-22 DIAGNOSIS — D649 Anemia, unspecified: Secondary | ICD-10-CM

## 2021-10-22 DIAGNOSIS — E559 Vitamin D deficiency, unspecified: Secondary | ICD-10-CM

## 2021-10-22 DIAGNOSIS — E782 Mixed hyperlipidemia: Secondary | ICD-10-CM

## 2021-10-22 MED ORDER — ATORVASTATIN CALCIUM 10 MG PO TABS: 10.0000 mg | ORAL_TABLET | Freq: Every day | ORAL | 3 refills | Status: DC

## 2021-10-22 MED ORDER — VITAMIN D (ERGOCALCIFEROL) 1.25 MG (50000 UNIT) PO CAPS
50000.0000 [IU] | ORAL_CAPSULE | ORAL | 0 refills | Status: DC
Start: 2021-10-22 — End: 2022-04-28

## 2021-10-22 NOTE — Progress Notes (Signed)
  Pt should start atorvastatin '10mg'$  daily for his hyperlipidemia.  Patient should avoid drinking alcohol while taking atorvastatin to avoid liver damage , report muscle ache and weakness Avoid foods that contain a lot of saturated and trans fat such as red meat, butter, fried foods and cheese. Eat a healthy diet,including lots of fruits and vegetables.    A1C well controlled, avoid sugar , sweets soda  Vitamin D deficiency .  Start taking vitamin D 50,000 once weekly for 8 weeks after 8 weeks start to take vitamin D 1000 daily.  Please schedule a follow-up appointment 8 weeks to recheck lipid panel. Patient should have fasting labs done 3 to 5 days before that visit.      The 10-year ASCVD risk score (Arnett DK, et al., 2019) is: 42.6%   Values used to calculate the score:     Age: 61 years     Sex: Male     Is Non-Hispanic African American: Yes     Diabetic: Yes     Tobacco smoker: Yes     Systolic Blood Pressure: 233 mmHg     Is BP treated: Yes     HDL Cholesterol: 70 mg/dL     Total Cholesterol: 246 mg/dL

## 2021-10-23 LAB — CMP14+EGFR
ALT: 18 IU/L (ref 0–44)
AST: 16 IU/L (ref 0–40)
Albumin/Globulin Ratio: 2 (ref 1.2–2.2)
Albumin: 4.4 g/dL (ref 3.8–4.8)
Alkaline Phosphatase: 73 IU/L (ref 44–121)
BUN/Creatinine Ratio: 13 (ref 10–24)
BUN: 15 mg/dL (ref 8–27)
Bilirubin Total: 0.4 mg/dL (ref 0.0–1.2)
CO2: 20 mmol/L (ref 20–29)
Calcium: 9.5 mg/dL (ref 8.6–10.2)
Chloride: 104 mmol/L (ref 96–106)
Creatinine, Ser: 1.16 mg/dL (ref 0.76–1.27)
Globulin, Total: 2.2 g/dL (ref 1.5–4.5)
Glucose: 123 mg/dL — ABNORMAL HIGH (ref 70–99)
Potassium: 4.3 mmol/L (ref 3.5–5.2)
Sodium: 139 mmol/L (ref 134–144)
Total Protein: 6.6 g/dL (ref 6.0–8.5)
eGFR: 71 mL/min/{1.73_m2} (ref >59)

## 2021-10-23 LAB — MICROALBUMIN / CREATININE URINE RATIO
Creatinine, Urine: 98.1 mg/dL
Microalb/Creat Ratio: 267 mg/g creat — ABNORMAL HIGH (ref 0–29)
Microalbumin, Urine: 262.2 ug/mL

## 2021-10-23 LAB — PSA: Prostate Specific Ag, Serum: 3.1 ng/mL (ref 0.0–4.0)

## 2021-10-23 LAB — CBC
Hematocrit: 37.2 % — ABNORMAL LOW (ref 37.5–51.0)
Hemoglobin: 12.1 g/dL — ABNORMAL LOW (ref 13.0–17.7)
MCH: 27.8 pg (ref 26.6–33.0)
MCHC: 32.5 g/dL (ref 31.5–35.7)
MCV: 86 fL (ref 79–97)
Platelets: 333 10*3/uL (ref 150–450)
RBC: 4.35 x10E6/uL (ref 4.14–5.80)
RDW: 14.1 % (ref 11.6–15.4)
WBC: 9.3 10*3/uL (ref 3.4–10.8)

## 2021-10-23 LAB — LIPID PANEL
Chol/HDL Ratio: 3.5 ratio (ref 0.0–5.0)
Cholesterol, Total: 246 mg/dL — ABNORMAL HIGH (ref 100–199)
HDL: 70 mg/dL (ref >39)
LDL Chol Calc (NIH): 116 mg/dL — ABNORMAL HIGH (ref 0–99)
Triglycerides: 349 mg/dL — ABNORMAL HIGH (ref 0–149)
VLDL Cholesterol Cal: 60 mg/dL — ABNORMAL HIGH (ref 5–40)

## 2021-10-23 LAB — VITAMIN D 25 HYDROXY (VIT D DEFICIENCY, FRACTURES): Vit D, 25-Hydroxy: 15.3 ng/mL — ABNORMAL LOW (ref 30.0–100.0)

## 2021-10-23 LAB — HEPATITIS C ANTIBODY: Hep C Virus Ab: NONREACTIVE

## 2021-10-23 LAB — HEMOGLOBIN A1C
Est. average glucose Bld gHb Est-mCnc: 131 mg/dL
Hgb A1c MFr Bld: 6.2 % — ABNORMAL HIGH (ref 4.8–5.6)

## 2021-10-23 LAB — TSH: TSH: 0.531 u[IU]/mL (ref 0.450–4.500)

## 2021-11-10 ENCOUNTER — Encounter: Payer: Self-pay | Admitting: *Deleted

## 2021-11-26 ENCOUNTER — Ambulatory Visit: Payer: PRIVATE HEALTH INSURANCE

## 2021-12-23 ENCOUNTER — Ambulatory Visit: Payer: PRIVATE HEALTH INSURANCE | Admitting: Nurse Practitioner

## 2022-04-23 ENCOUNTER — Ambulatory Visit (INDEPENDENT_AMBULATORY_CARE_PROVIDER_SITE_OTHER): Payer: PRIVATE HEALTH INSURANCE | Admitting: Family Medicine

## 2022-04-23 ENCOUNTER — Ambulatory Visit: Payer: PRIVATE HEALTH INSURANCE | Admitting: Family Medicine

## 2022-04-23 ENCOUNTER — Encounter: Payer: Self-pay | Admitting: Family Medicine

## 2022-04-23 VITALS — BP 146/82 | HR 92 | Ht 71.0 in | Wt 177.1 lb

## 2022-04-23 DIAGNOSIS — R7301 Impaired fasting glucose: Secondary | ICD-10-CM

## 2022-04-23 DIAGNOSIS — D3190 Benign neoplasm of unspecified part of unspecified eye: Secondary | ICD-10-CM

## 2022-04-23 DIAGNOSIS — I10 Essential (primary) hypertension: Secondary | ICD-10-CM

## 2022-04-23 DIAGNOSIS — E559 Vitamin D deficiency, unspecified: Secondary | ICD-10-CM

## 2022-04-23 DIAGNOSIS — E038 Other specified hypothyroidism: Secondary | ICD-10-CM

## 2022-04-23 DIAGNOSIS — E7849 Other hyperlipidemia: Secondary | ICD-10-CM

## 2022-04-23 DIAGNOSIS — E782 Mixed hyperlipidemia: Secondary | ICD-10-CM | POA: Diagnosis not present

## 2022-04-23 DIAGNOSIS — D3160 Benign neoplasm of unspecified site of unspecified orbit: Secondary | ICD-10-CM

## 2022-04-23 DIAGNOSIS — F17200 Nicotine dependence, unspecified, uncomplicated: Secondary | ICD-10-CM

## 2022-04-23 MED ORDER — ATORVASTATIN CALCIUM 10 MG PO TABS: 10.0000 mg | ORAL_TABLET | Freq: Every day | ORAL | 3 refills | Status: DC

## 2022-04-23 MED ORDER — AMLODIPINE BESYLATE 10 MG PO TABS: 10.0000 mg | ORAL_TABLET | Freq: Every day | ORAL | 1 refills | Status: DC

## 2022-04-23 MED ORDER — LOSARTAN POTASSIUM 100 MG PO TABS: 100.0000 mg | ORAL_TABLET | Freq: Every day | ORAL | 1 refills | Status: DC

## 2022-04-23 NOTE — Patient Instructions (Addendum)
I appreciate the opportunity to provide care to you today!    Follow up:  1 months BP  Labs: please stop by the lab during the week to get your blood drawn (CBC, CMP, TSH, Lipid profile, HgA1c, Vit D)  The lesion in your eyes bilaterally is call a dermoid cyst; this is a noncancerous growth of tissue around the eye; referral has been placed to ophthalmologist in [facility] for further evaluation.  Please start taking losartan 100 mg daily with amlodipine 10 mg daily for you blood pressure  Please also take your atorvastatin 10 mg daily for your cholesterol  Referrals today-ophthalmologist   Please continue to a heart-healthy diet and increase your physical activities. Try to exercise for 41mns at least three times a week.      It was a pleasure to see you and I look forward to continuing to work together on your health and well-being. Please do not hesitate to call the office if you need care or have questions about your care.   Have a wonderful day and week. With Gratitude, GAlvira MondayMSN, FNP-BC

## 2022-04-23 NOTE — Assessment & Plan Note (Signed)
Bilateral Dermoid cyst Referral placed to ophthalmology He denies eye pain, drainage, and redness but c/o of difficulty driving at nighttime and into the sun

## 2022-04-23 NOTE — Assessment & Plan Note (Signed)
Will assess lipid panel today Refill atorvastatin 10 mg daily and encourage patient to start taking Lab Results  Component Value Date   CHOL 246 (H) 10/21/2021   HDL 70 10/21/2021   LDLCALC 116 (H) 10/21/2021   TRIG 349 (H) 10/21/2021   CHOLHDL 3.5 10/21/2021

## 2022-04-23 NOTE — Progress Notes (Signed)
Established Patient Office Visit  Subjective:  Patient ID: David Nicholson, male    DOB: 06/04/1961  Age: 61 y.o. MRN: [id number]  CC:  Chief Complaint  Patient presents with   Follow-up    6 month f/u, has eye complication on right eye, onset of problem began abotu 6 months ago, on and off.     HPI David Nicholson is a 61 y.o. male with past medical history of  hypertension diabetes, hyperlipidemia, alcohol abuse, presents for f/u of  chronic medical conditions.  Hypertension: He takes amlodipine 10 mg and losartan 50 mg daily.  He denies headaches, dizziness, blurred vision and reports compliance with treatment regimen.  Hyperlipidemia: He is prescribed atorvastatin 10 mg daily.  He reports not taking medication due to cost in the last 6 months.   Lesion at the eyes bilaterally: He complains of a growth on the lateral aspect of his eyes.  He denies eye pain, drainage, and redness, but complains of difficulty with nighttime vision due to the glare.He also has difficulty driving into the sun.  He noted that the lesion has increased in size since it's onset 6 months ago.     Past Medical History:  Diagnosis Date   Anxiety    CKD (chronic kidney disease) stage 3, GFR 30-59 ml/min (HCC) 10/15/2019   Diabetes mellitus without complication ([place]) 74/82/7078   GERD (gastroesophageal reflux disease)    Gout    HLD (hyperlipidemia) 10/15/2019   Hypertension    Substance abuse ([place])     Past Surgical History:  Procedure Laterality Date   COLONOSCOPY  06/30/2011   Procedure: COLONOSCOPY;  Surgeon: Daneil Dolin, MD;  Location: [facility];  Service: Endoscopy;  Laterality: N/A;  7:30 AM    Family History  Problem Relation Age of Onset   Diabetes Mother    Hypertension Mother    Alzheimer's disease Father    Diabetes Sister    Hypertension Sister    Diabetes Brother    Hypertension Brother    Lung cancer Neg Hx    Prostate cancer Neg Hx    Cancer - Colon Neg Hx     Social  History   Socioeconomic History   Marital status: Single    Spouse name: Not on file   Number of children: 1   Years of education: Not on file   Highest education level: Not on file  Occupational History   Not on file  Tobacco Use   Smoking status: Every Day    Types: Cigars, Cigarettes   Smokeless tobacco: Never   Tobacco comments:    Pt stated the he smoked less than a pack daily off and on for 20 years. He quit in 2022. Pt states he smokes black and mild, about 1 a day.   Vaping Use   Vaping Use: Never used  Substance and Sexual Activity   Alcohol use: Yes    Comment: 2-3 beers every other day    Drug use: Not Currently    Types: Cocaine, Marijuana, Oxycodone    Comment: none since 02/2018.    Sexual activity: Yes    Comment: has one partner  Other Topics Concern   Not on file  Social History Narrative   Single,common-law wife of 18 years.Lives with common-law wife. Works at [employer]: Not on [employer] Insecurity: Not on file  Transportation Needs: Not on file  Physical Activity: Not  on file  Stress: Not on file  Social Connections: Not on file  Intimate Partner Violence: Not on file    Outpatient Medications Prior to Visit  Medication Sig Dispense Refill   acetaminophen (TYLENOL) 325 MG tablet Take 325 mg by mouth every 6 (six) hours as needed.     benzonatate (TESSALON) 100 MG capsule Take 1 capsule (100 mg total) by mouth 2 (two) times daily as needed for cough. 20 capsule 0   cetirizine (ZYRTEC) 10 MG tablet Take 1 tablet (10 mg total) by mouth daily. 30 tablet 1   fluticasone (FLONASE) 50 MCG/ACT nasal spray Place 2 sprays into both nostrils daily. 16 g 2   ibuprofen (ADVIL) 600 MG tablet Take 600 mg by mouth every 6 (six) hours as needed.     sildenafil (VIAGRA) 100 MG tablet Take 0.5 tablets (50 mg total) by mouth daily as needed for erectile dysfunction. 6 tablet 3   Vitamin D, Ergocalciferol,  (DRISDOL) 1.25 MG (50000 UNIT) CAPS capsule Take 1 capsule (50,000 Units total) by mouth every 7 (seven) days. 8 capsule 0   amLODipine (NORVASC) 10 MG tablet Take 1 tablet (10 mg total) by mouth daily. 90 tablet 1   atorvastatin (LIPITOR) 10 MG tablet Take 1 tablet (10 mg total) by mouth daily. 90 tablet 3   losartan (COZAAR) 50 MG tablet Take 1 tablet (50 mg total) by mouth daily. 90 tablet 1   No facility-administered medications prior to visit.    No Known Allergies  ROS Review of Systems  Constitutional:  Negative for fatigue and fever.  Eyes:  Negative for pain, discharge and itching.       Lesion of the eyes  Respiratory:  Negative for chest tightness and shortness of breath.   Cardiovascular:  Negative for chest pain and palpitations.  Neurological:  Negative for dizziness and headaches.      Objective:    Physical Exam HENT:     Head: Normocephalic.     Right Ear: External ear normal.     Left Ear: External ear normal.  Eyes:     Extraocular Movements:     Right eye: Normal extraocular motion.     Left eye: Normal extraocular motion.     Comments: Dermoid cyst of the orbit bilaterally  Cardiovascular:     Rate and Rhythm: Normal rate.     Pulses: Normal pulses.     Heart sounds: Normal heart sounds.  Pulmonary:     Effort: Pulmonary effort is normal.     Breath sounds: Normal breath sounds.  Musculoskeletal:     Cervical back: No rigidity.  Skin:    Coloration: Skin is not jaundiced.     Findings: No bruising.  Neurological:     Mental Status: He is alert.     BP (!) 146/82   Pulse 92   Ht _0  (1.803 m)   Wt 177 lb 1.9 oz (80.3 kg)   SpO2 100%   BMI 24.70 kg/m  Wt Readings from Last 3 Encounters:  04/23/22 177 lb 1.9 oz (80.3 kg)  10/21/21 172 lb (78 kg)  09/16/21 171 lb (77.6 kg)    Lab Results  Component Value Date   TSH 0.531 10/21/2021   Lab Results  Component Value Date   WBC 9.3 10/21/2021   HGB 12.1 (L) 10/21/2021   HCT 37.2  (L) 10/21/2021   MCV 86 10/21/2021   PLT 333 10/21/2021   Lab Results  Component Value Date  NA 139 10/21/2021   K 4.3 10/21/2021   CO2 20 10/21/2021   GLUCOSE 123 (H) 10/21/2021   BUN 15 10/21/2021   CREATININE 1.16 10/21/2021   BILITOT 0.4 10/21/2021   ALKPHOS 73 10/21/2021   AST 16 10/21/2021   ALT 18 10/21/2021   PROT 6.6 10/21/2021   ALBUMIN 4.4 10/21/2021   CALCIUM 9.5 10/21/2021   ANIONGAP 11 02/24/2020   EGFR 71 10/21/2021   Lab Results  Component Value Date   CHOL 246 (H) 10/21/2021   Lab Results  Component Value Date   HDL 70 10/21/2021   Lab Results  Component Value Date   LDLCALC 116 (H) 10/21/2021   Lab Results  Component Value Date   TRIG 349 (H) 10/21/2021   Lab Results  Component Value Date   CHOLHDL 3.5 10/21/2021   Lab Results  Component Value Date   HGBA1C 6.2 (H) 10/21/2021      Assessment & Plan:  Primary hypertension Assessment & Plan: Uncontrolled Will discontinue losartan 50 mg today We will start patient on losartan 100 mg today Encouraged to start taking losartan 100 mg daily and amlodipine 10 mg daily Dash diet reviewed BP Readings from Last 3 Encounters:  04/23/22 (!) 146/82  10/21/21 136/83  09/16/21 (!) 145/88     Orders: -     amLODIPine Besylate; Take 1 tablet (10 mg total) by mouth daily.  Dispense: 90 tablet; Refill: 1 -     Losartan Potassium; Take 1 tablet (100 mg total) by mouth daily.  Dispense: 90 tablet; Refill: 1 -     CMP14+EGFR -     CBC with Differential/Platelet  Dermoid cyst of orbit, unspecified laterality Assessment & Plan: Bilateral Dermoid cyst Referral placed to ophthalmology He denies eye pain, drainage, and redness but c/o of difficulty driving at nighttime and into the sun     Other hyperlipidemia Assessment & Plan: Will assess lipid panel today Refill atorvastatin 10 mg daily and encourage patient to start taking Lab Results  Component Value Date   CHOL 246 (H) 10/21/2021   HDL  70 10/21/2021   LDLCALC 116 (H) 10/21/2021   TRIG 349 (H) 10/21/2021   CHOLHDL 3.5 10/21/2021      Mixed hyperlipidemia Assessment & Plan: Will assess lipid panel today Refill atorvastatin 10 mg daily and encourage patient to start taking Lab Results  Component Value Date   CHOL 246 (H) 10/21/2021   HDL 70 10/21/2021   LDLCALC 116 (H) 10/21/2021   TRIG 349 (H) 10/21/2021   CHOLHDL 3.5 10/21/2021     Orders: -     Atorvastatin Calcium; Take 1 tablet (10 mg total) by mouth daily.  Dispense: 90 tablet; Refill: 3 -     Lipid panel  Vitamin D deficiency -     VITAMIN D 25 Hydroxy (Vit-D Deficiency, Fractures)  Current every day smoker  IFG (impaired fasting glucose) -     Hemoglobin A1c  Other specified hypothyroidism -     TSH + free T4  Dermoid cyst of globe of eye -     Ambulatory referral to Ophthalmology    Follow-up: Return in about 1 month (around 05/23/2022) for BP.   Alvira Monday, FNP

## 2022-04-23 NOTE — Assessment & Plan Note (Signed)
Uncontrolled Will discontinue losartan 50 mg today We will start patient on losartan 100 mg today Encouraged to start taking losartan 100 mg daily and amlodipine 10 mg daily Dash diet reviewed BP Readings from Last 3 Encounters:  04/23/22 (!) 146/82  10/21/21 136/83  09/16/21 (!) 145/88

## 2022-04-28 ENCOUNTER — Other Ambulatory Visit: Payer: Self-pay | Admitting: Family Medicine

## 2022-04-28 DIAGNOSIS — E559 Vitamin D deficiency, unspecified: Secondary | ICD-10-CM

## 2022-04-28 DIAGNOSIS — E782 Mixed hyperlipidemia: Secondary | ICD-10-CM

## 2022-04-28 DIAGNOSIS — E1100 Type 2 diabetes mellitus with hyperosmolarity without nonketotic hyperglycemic-hyperosmolar coma (NKHHC): Secondary | ICD-10-CM

## 2022-04-28 LAB — CMP14+EGFR
ALT: 37 IU/L (ref 0–44)
AST: 20 IU/L (ref 0–40)
Albumin/Globulin Ratio: 1.7 (ref 1.2–2.2)
Albumin: 4.5 g/dL (ref 3.9–4.9)
Alkaline Phosphatase: 78 IU/L (ref 44–121)
BUN/Creatinine Ratio: 13 (ref 10–24)
BUN: 15 mg/dL (ref 8–27)
Bilirubin Total: 0.3 mg/dL (ref 0.0–1.2)
CO2: 23 mmol/L (ref 20–29)
Calcium: 9.8 mg/dL (ref 8.6–10.2)
Chloride: 100 mmol/L (ref 96–106)
Creatinine, Ser: 1.13 mg/dL (ref 0.76–1.27)
Globulin, Total: 2.7 g/dL (ref 1.5–4.5)
Glucose: 108 mg/dL — ABNORMAL HIGH (ref 70–99)
Potassium: 3.9 mmol/L (ref 3.5–5.2)
Sodium: 140 mmol/L (ref 134–144)
Total Protein: 7.2 g/dL (ref 6.0–8.5)
eGFR: 73 mL/min/{1.73_m2} (ref >59)

## 2022-04-28 LAB — CBC WITH DIFFERENTIAL/PLATELET
Basophils Absolute: 0 10*3/uL (ref 0.0–0.2)
Basos: 0 %
EOS (ABSOLUTE): 0.1 10*3/uL (ref 0.0–0.4)
Eos: 1 %
Hematocrit: 42.7 % (ref 37.5–51.0)
Hemoglobin: 13.6 g/dL (ref 13.0–17.7)
Immature Grans (Abs): 0.1 10*3/uL (ref 0.0–0.1)
Immature Granulocytes: 1 %
Lymphocytes Absolute: 1.5 10*3/uL (ref 0.7–3.1)
Lymphs: 15 %
MCH: 27.1 pg (ref 26.6–33.0)
MCHC: 31.9 g/dL (ref 31.5–35.7)
MCV: 85 fL (ref 79–97)
Monocytes Absolute: 0.7 10*3/uL (ref 0.1–0.9)
Monocytes: 7 %
Neutrophils Absolute: 7.3 10*3/uL — ABNORMAL HIGH (ref 1.4–7.0)
Neutrophils: 76 %
Platelets: 346 10*3/uL (ref 150–450)
RBC: 5.01 x10E6/uL (ref 4.14–5.80)
RDW: 13.5 % (ref 11.6–15.4)
WBC: 9.7 10*3/uL (ref 3.4–10.8)

## 2022-04-28 LAB — HEMOGLOBIN A1C
Est. average glucose Bld gHb Est-mCnc: 140 mg/dL
Hgb A1c MFr Bld: 6.5 % — ABNORMAL HIGH (ref 4.8–5.6)

## 2022-04-28 LAB — LIPID PANEL
Chol/HDL Ratio: 4.1 ratio (ref 0.0–5.0)
Cholesterol, Total: 273 mg/dL — ABNORMAL HIGH (ref 100–199)
HDL: 67 mg/dL (ref >39)
LDL Chol Calc (NIH): 99 mg/dL (ref 0–99)
Triglycerides: 639 mg/dL (ref 0–149)
VLDL Cholesterol Cal: 107 mg/dL — ABNORMAL HIGH (ref 5–40)

## 2022-04-28 LAB — VITAMIN D 25 HYDROXY (VIT D DEFICIENCY, FRACTURES): Vit D, 25-Hydroxy: 13.8 ng/mL — ABNORMAL LOW (ref 30.0–100.0)

## 2022-04-28 LAB — TSH+FREE T4
Free T4: 1.45 ng/dL (ref 0.82–1.77)
TSH: 0.464 u[IU]/mL (ref 0.450–4.500)

## 2022-04-28 MED ORDER — VITAMIN D (ERGOCALCIFEROL) 1.25 MG (50000 UNIT) PO CAPS: 50000.0000 [IU] | ORAL_CAPSULE | ORAL | 3 refills | Status: DC

## 2022-04-28 MED ORDER — METFORMIN HCL 500 MG PO TABS: 500.0000 mg | ORAL_TABLET | Freq: Two times a day (BID) | ORAL | 3 refills | Status: DC

## 2022-04-28 MED ORDER — ATORVASTATIN CALCIUM 40 MG PO TABS: 40.0000 mg | ORAL_TABLET | Freq: Every day | ORAL | 3 refills | Status: DC

## 2022-04-28 NOTE — Progress Notes (Signed)
  Please inform the patient that he has diabetes.  I started him on metformin 500 mg daily.  His triglyceride level is elevated; therefore, increase his atorvastatin to 40 mg daily.  His vitamin D is low, and the refill of once weekly vitamin D supplement was sent to the pharmacy.  All other labs are stable.

## 2022-04-28 NOTE — Progress Notes (Signed)
The 10-year ASCVD risk score (Arnett DK, et al., 2019) is: 50%   Values used to calculate the score:     Age: 61 years     Sex: Male     Is Non-Hispanic African American: Yes     Diabetic: Yes     Tobacco smoker: Yes     Systolic Blood Pressure: 982 mmHg     Is BP treated: Yes     HDL Cholesterol: 67 mg/dL     Total Cholesterol: 273 mg/dL

## 2022-05-07 ENCOUNTER — Other Ambulatory Visit: Payer: Self-pay | Admitting: Nurse Practitioner

## 2022-05-14 ENCOUNTER — Ambulatory Visit: Payer: PRIVATE HEALTH INSURANCE | Admitting: Family Medicine

## 2022-05-25 ENCOUNTER — Ambulatory Visit: Payer: PRIVATE HEALTH INSURANCE | Admitting: Family Medicine

## 2022-05-25 ENCOUNTER — Encounter: Payer: Self-pay | Admitting: Family Medicine

## 2022-06-18 ENCOUNTER — Telehealth: Payer: Self-pay | Admitting: Family Medicine

## 2022-06-18 ENCOUNTER — Ambulatory Visit: Payer: PRIVATE HEALTH INSURANCE | Admitting: Family Medicine

## 2022-06-18 NOTE — Telephone Encounter (Signed)
Kindly sent a letter

## 2022-06-18 NOTE — Telephone Encounter (Signed)
Pt has missed 3 no shows as of today (12/23/21, 05/15/22, 06/18/22). How would you like to proceed?

## 2022-06-21 ENCOUNTER — Encounter: Payer: Self-pay | Admitting: Family Medicine

## 2022-06-22 ENCOUNTER — Encounter: Payer: Self-pay | Admitting: Internal Medicine

## 2022-06-22 ENCOUNTER — Encounter: Payer: Self-pay | Admitting: *Deleted

## 2022-06-22 ENCOUNTER — Ambulatory Visit (INDEPENDENT_AMBULATORY_CARE_PROVIDER_SITE_OTHER): Payer: PRIVATE HEALTH INSURANCE | Admitting: Internal Medicine

## 2022-06-22 VITALS — BP 156/78 | HR 99 | Ht 71.0 in | Wt 176.1 lb

## 2022-06-22 DIAGNOSIS — Z1211 Encounter for screening for malignant neoplasm of colon: Secondary | ICD-10-CM | POA: Diagnosis not present

## 2022-06-22 DIAGNOSIS — M1A9XX0 Chronic gout, unspecified, without tophus (tophi): Secondary | ICD-10-CM | POA: Diagnosis not present

## 2022-06-22 DIAGNOSIS — I10 Essential (primary) hypertension: Secondary | ICD-10-CM

## 2022-06-22 DIAGNOSIS — M109 Gout, unspecified: Secondary | ICD-10-CM | POA: Insufficient documentation

## 2022-06-22 NOTE — Assessment & Plan Note (Signed)
Blood pressure is elevated 152/79 today.  His losartan was increased at last visit from 50 mg to 100 mg.  He is compliant with losartan and amlodipine 10 mg.  He has checked his blood pressure at home and SBP less than 130 on checks.  No lower extremity edema.  Patient has eaten some high salt foods recently.   Assessment/Plan: Chronic illness with exacerbation - No changes today because ambulatory monitoring has been normal. - Encouraged diet similar to DASH diet - Continue losartan 100 mg daily - Continue amlodipine 10 mg daily - BMP - Follow up in 3 months

## 2022-06-22 NOTE — Assessment & Plan Note (Signed)
Referral placed for screening colonoscopy.

## 2022-06-22 NOTE — Progress Notes (Signed)
   HPI:[name] is a 62 y.o. male who presents for evaluation of Hypertension (Follow up) . For the details of today's visit, please refer to the assessment and plan.  Physical Exam: Vitals:   06/22/22 1125 06/22/22 1131  BP: (!) 152/79 (!) 156/78  Pulse: 99   SpO2: 98%   Weight: 176 lb 1.9 oz (79.9 kg)   Height: '5\' 11"'$  (1.803 m)      Physical Exam Constitutional:      Appearance: He is well-developed and well-groomed.  Eyes:     General: No scleral icterus.    Conjunctiva/sclera: Conjunctivae normal.  Cardiovascular:     Rate and Rhythm: Normal rate and regular rhythm.     Heart sounds: No murmur heard.    No friction rub. No gallop.  Pulmonary:     Effort: Pulmonary effort is normal.     Breath sounds: No wheezing, rhonchi or rales.  Musculoskeletal:        General: Swelling (swelling in right great toe, not hot or red) present.     Right lower leg: No edema.     Left lower leg: No edema.  Skin:    General: Skin is warm and dry.      Assessment & Plan:   Hypertension Blood pressure is elevated 152/79 today.  His losartan was increased at last visit from 50 mg to 100 mg.  He is compliant with losartan and amlodipine 10 mg.  He has checked his blood pressure at home and SBP less than 130 on checks.  No lower extremity edema.  Patient has eaten some high salt foods recently.   Assessment/Plan: Chronic illness with exacerbation - No changes today because ambulatory monitoring has been normal. - Encouraged diet similar to DASH diet - Continue losartan 100 mg daily - Continue amlodipine 10 mg daily - BMP - Follow up in 3 months   Gout Patient had recent gout flare which has improved. Has some mild swelling in right great toe.   Assessment/Plan: Chronic illness with exacerbation -  Patient is on Losartan for BP which has benefit in lowering uric acid levels.  - Will check uric acid level today, with keeping in mind he has had recent flair.  -  Recommended cutting back on alcohol use.     Lorene Dy, MD

## 2022-06-22 NOTE — Assessment & Plan Note (Signed)
Patient had recent gout flare which has improved. Has some mild swelling in right great toe.   Assessment/Plan: Chronic illness with exacerbation -  Patient is on Losartan for BP which has benefit in lowering uric acid levels.  - Will check uric acid level today, with keeping in mind he has had recent flair.  - Recommended cutting back on alcohol use.

## 2022-06-22 NOTE — Patient Instructions (Addendum)
Thank you for trusting me with your care. To recap, today we discussed the following:   Gout - Check uric acid level  Hypertension BP Readings from Last 3 Encounters:  06/22/22 (!) 156/78  04/23/22 (!) 146/82  10/21/21 136/83   BP Controlled at home - Continue Losartan 100 mg and amlodipine 10 mg  Referral placed for screening colonoscopy

## 2022-09-24 ENCOUNTER — Ambulatory Visit: Payer: PRIVATE HEALTH INSURANCE | Admitting: Family Medicine

## 2022-09-28 ENCOUNTER — Ambulatory Visit: Payer: PRIVATE HEALTH INSURANCE | Admitting: Family Medicine

## 2022-09-30 ENCOUNTER — Encounter: Payer: Self-pay | Admitting: Family Medicine

## 2022-11-03 ENCOUNTER — Ambulatory Visit: Payer: PRIVATE HEALTH INSURANCE

## 2022-12-07 ENCOUNTER — Encounter: Payer: Self-pay | Admitting: *Deleted

## 2023-03-02 ENCOUNTER — Telehealth: Payer: Self-pay | Admitting: Family Medicine

## 2023-03-02 NOTE — Telephone Encounter (Signed)
Patient calling wanting to schedule an appt- patient has 3 no shows. Please advise on scheduling. Thank you

## 2023-03-03 NOTE — Telephone Encounter (Signed)
You can schedule him

## 2023-03-14 NOTE — Telephone Encounter (Signed)
Call cannot be completed  

## 2023-03-15 DIAGNOSIS — I469 Cardiac arrest, cause unspecified: Secondary | ICD-10-CM

## 2023-12-21 ENCOUNTER — Encounter: Payer: Self-pay | Admitting: Orthopaedic Surgery

## 2023-12-21 ENCOUNTER — Ambulatory Visit: Admitting: Orthopaedic Surgery

## 2023-12-21 VITALS — BP 192/104 | HR 105 | Ht 71.0 in | Wt 174.4 lb

## 2023-12-21 DIAGNOSIS — M10022 Idiopathic gout, left elbow: Secondary | ICD-10-CM | POA: Diagnosis not present

## 2023-12-21 DIAGNOSIS — M10071 Idiopathic gout, right ankle and foot: Secondary | ICD-10-CM | POA: Diagnosis not present

## 2023-12-21 MED ORDER — OXYCODONE-ACETAMINOPHEN 5-325 MG PO TABS: ORAL_TABLET | ORAL | 0 refills | Status: DC

## 2023-12-21 MED ORDER — PREDNISONE 5 MG (21) PO TBPK: ORAL_TABLET | ORAL | 0 refills | Status: DC

## 2023-12-21 MED ORDER — ALLOPURINOL 300 MG PO TABS: 300.0000 mg | ORAL_TABLET | Freq: Every day | ORAL | 5 refills | Status: DC

## 2023-12-21 NOTE — Progress Notes (Signed)
 Subjective:    Patient ID: David Nicholson, male    DOB: 04-19-1961, 63 y.o.   MRN: [id number]  HPI He has a history of gout.  He has pain and swelling of the right great toe and of the left elbow.  The left elbow is more painful.  He has no trauma.  He is not taking any medicine for his gout.  He is in much pain.  The elbow has swelling and slight heat but not much redness.   Review of Systems  Constitutional:  Positive for activity change.  Musculoskeletal:  Positive for arthralgias, gait problem, joint swelling and myalgias.  All other systems reviewed and are negative. For Review of Systems, all other systems reviewed and are negative.  The following is a summary of the past history medically, past history surgically, known current medicines, social history and family history.  This information is gathered electronically by the computer from prior information and documentation.  I review this each visit and have found including this information at this point in the chart is beneficial and informative.   Past Medical History:  Diagnosis Date   Anxiety    CKD (chronic kidney disease) stage 3, GFR 30-59 ml/min (HCC) 10/15/2019   Diabetes mellitus without complication (HCC) 07/25/2018   GERD (gastroesophageal reflux disease)    Gout    HLD (hyperlipidemia) 10/15/2019   Hypertension    Substance abuse (HCC)     Past Surgical History:  Procedure Laterality Date   COLONOSCOPY  06/30/2011   Procedure: COLONOSCOPY;  Surgeon: Lamar CHRISTELLA Hollingshead, MD;  Location: [facility];  Service: Endoscopy;  Laterality: N/A;  7:30 AM    Current Outpatient Medications on File Prior to Visit  Medication Sig Dispense Refill   acetaminophen  (TYLENOL ) 325 MG tablet Take 325 mg by mouth every 6 (six) hours as needed.     amLODipine  (NORVASC ) 10 MG tablet Take 1 tablet (10 mg total) by mouth daily. 90 tablet 1   atorvastatin  (LIPITOR) 40 MG tablet Take 1 tablet (40 mg total) by mouth daily. 90 tablet 3    benzonatate  (TESSALON ) 100 MG capsule Take 1 capsule (100 mg total) by mouth 2 (two) times daily as needed for cough. 20 capsule 0   cetirizine  (ZYRTEC ) 10 MG tablet Take 1 tablet (10 mg total) by mouth daily. 30 tablet 1   fluticasone  (FLONASE ) 50 MCG/ACT nasal spray Place 2 sprays into both nostrils daily. 16 g 2   ibuprofen  (ADVIL ) 600 MG tablet Take 600 mg by mouth every 6 (six) hours as needed.     losartan  (COZAAR ) 100 MG tablet Take 1 tablet (100 mg total) by mouth daily. 90 tablet 1   metFORMIN  (GLUCOPHAGE ) 500 MG tablet Take 1 tablet (500 mg total) by mouth 2 (two) times daily with a meal. 90 tablet 3   sildenafil  (VIAGRA ) 100 MG tablet Take 0.5 tablets (50 mg total) by mouth daily as needed for erectile dysfunction. 6 tablet 3   Vitamin D , Ergocalciferol , (DRISDOL ) 1.25 MG (50000 UNIT) CAPS capsule Take 1 capsule (50,000 Units total) by mouth every 7 (seven) days. 8 capsule 3   No current facility-administered medications on file prior to visit.    Social History   Socioeconomic History   Marital status: Single    Spouse name: Not on file   Number of children: 1   Years of education: Not on file   Highest education level: Not on file  Occupational History   Not on file  Tobacco Use   Smoking status: Every Day    Types: Cigars, Cigarettes   Smokeless tobacco: Never   Tobacco comments:    Pt stated the he smoked less than a pack daily off and on for 20 years. He quit in 2022. Pt states he smokes black and mild, about 1 a day.   Vaping Use   Vaping status: Never Used  Substance and Sexual Activity   Alcohol use: Yes    Comment: 2-3 beers every other day    Drug use: Not Currently    Types: Cocaine, Marijuana, Oxycodone     Comment: none since 02/2018.    Sexual activity: Yes    Comment: has one partner  Other Topics Concern   Not on file  Social History Narrative   Single,common-law wife of 18 years.Lives with common-law wife. Works at [employer] of  [employer]: Not on [employer] Insecurity: Not on file  Transportation Needs: Not on file  Physical Activity: Not on file  Stress: Not on file  Social Connections: Not on file  Intimate Partner Violence: Not on file    Family History  Problem Relation Age of Onset   Diabetes Mother    Hypertension Mother    Alzheimer's disease Father    Diabetes Sister    Hypertension Sister    Diabetes Brother    Hypertension Brother    Lung cancer Neg Hx    Prostate cancer Neg Hx    Cancer - Colon Neg Hx     BP (!) 192/104   Pulse (!) 105   Ht 5' 11 (1.803 m)   Wt 174 lb 6 oz (79.1 kg)   BMI 24.32 kg/m   Body mass index is 24.32 kg/m.      Objective:   Physical Exam Vitals and nursing note reviewed. Exam conducted with a chaperone present.  Constitutional:      Appearance: He is well-developed.  HENT:     Head: Normocephalic and atraumatic.  Eyes:     Conjunctiva/sclera: Conjunctivae normal.     Pupils: Pupils are equal, round, and reactive to light.  Cardiovascular:     Rate and Rhythm: Normal rate and regular rhythm.  Pulmonary:     Effort: Pulmonary effort is normal.  Abdominal:     Palpations: Abdomen is soft.  Musculoskeletal:       Arms:     Cervical back: Normal range of motion and neck supple.       Legs:  Skin:    General: Skin is warm and dry.  Neurological:     Mental Status: He is alert and oriented to person, place, and time.     Cranial Nerves: No cranial nerve deficit.     Motor: No abnormal muscle tone.     Coordination: Coordination normal.     Deep Tendon Reflexes: Reflexes are normal and symmetric. Reflexes normal.  Psychiatric:        Behavior: Behavior normal.        Thought Content: Thought content normal.        Judgment: Judgment normal.           Assessment & Plan:   Encounter Diagnoses  Name Primary?   Acute idiopathic gout of left elbow Yes   Acute idiopathic gout of right foot    I will give pain  medicine.  I have reviewed the [place]  Controlled Substance Reporting System web site prior to  prescribing narcotic medicine for this patient.  I will put him on medrol dose pack.  I will begin allopurinol .  A posterior upper extremity splint applied on the left and a sling.  Return in one week.  Consider serum uric acid level then and perhaps X-rays of the elbow on the left.  Call if any problem.  Precautions discussed.  Electronically Signed Lemond Stable, MD 7/16/20253:17 PM

## 2023-12-28 ENCOUNTER — Encounter: Payer: Self-pay | Admitting: Orthopaedic Surgery

## 2023-12-28 ENCOUNTER — Ambulatory Visit (INDEPENDENT_AMBULATORY_CARE_PROVIDER_SITE_OTHER): Payer: Self-pay

## 2023-12-28 ENCOUNTER — Encounter (HOSPITAL_COMMUNITY): Payer: Self-pay

## 2023-12-28 ENCOUNTER — Other Ambulatory Visit: Payer: Self-pay

## 2023-12-28 ENCOUNTER — Ambulatory Visit (INDEPENDENT_AMBULATORY_CARE_PROVIDER_SITE_OTHER): Payer: PRIVATE HEALTH INSURANCE | Admitting: Orthopaedic Surgery

## 2023-12-28 ENCOUNTER — Emergency Department (HOSPITAL_COMMUNITY): Payer: PRIVATE HEALTH INSURANCE

## 2023-12-28 ENCOUNTER — Ambulatory Visit: Payer: Self-pay | Admitting: Family Medicine

## 2023-12-28 ENCOUNTER — Emergency Department (HOSPITAL_COMMUNITY)
Admission: EM | Admit: 2023-12-28 | Discharge: 2023-12-28 | Disposition: A | Discharge status: Home / Self Care | Payer: PRIVATE HEALTH INSURANCE | Source: Ambulatory Visit | Attending: Emergency Medicine | Admitting: Emergency Medicine

## 2023-12-28 VITALS — BP 206/107 | HR 76

## 2023-12-28 DIAGNOSIS — M10022 Idiopathic gout, left elbow: Secondary | ICD-10-CM

## 2023-12-28 DIAGNOSIS — D72829 Elevated white blood cell count, unspecified: Secondary | ICD-10-CM | POA: Diagnosis not present

## 2023-12-28 DIAGNOSIS — M25522 Pain in left elbow: Secondary | ICD-10-CM

## 2023-12-28 DIAGNOSIS — I159 Secondary hypertension, unspecified: Secondary | ICD-10-CM | POA: Diagnosis present

## 2023-12-28 DIAGNOSIS — I10 Essential (primary) hypertension: Secondary | ICD-10-CM

## 2023-12-28 LAB — COMPREHENSIVE METABOLIC PANEL WITH GFR
ALT: 19 U/L (ref 0–44)
AST: 21 U/L (ref 15–41)
Albumin: 3.5 g/dL (ref 3.5–5.0)
Alkaline Phosphatase: 79 U/L (ref 38–126)
Anion gap: 12 (ref 5–15)
BUN: 20 mg/dL (ref 8–23)
CO2: 23 mmol/L (ref 22–32)
Calcium: 9.5 mg/dL (ref 8.9–10.3)
Chloride: 100 mmol/L (ref 98–111)
Creatinine, Ser: 1.33 mg/dL — ABNORMAL HIGH (ref 0.61–1.24)
GFR, Estimated: 60 mL/min — ABNORMAL LOW (ref >60)
Glucose, Bld: 178 mg/dL — ABNORMAL HIGH (ref 70–99)
Potassium: 3.5 mmol/L (ref 3.5–5.1)
Sodium: 135 mmol/L (ref 135–145)
Total Bilirubin: 0.4 mg/dL (ref 0.0–1.2)
Total Protein: 7.4 g/dL (ref 6.5–8.1)

## 2023-12-28 LAB — CBC WITH DIFFERENTIAL/PLATELET
Abs Immature Granulocytes: 0.34 K/uL — ABNORMAL HIGH (ref 0.00–0.07)
Basophils Absolute: 0.1 K/uL (ref 0.0–0.1)
Basophils Relative: 1 %
Eosinophils Absolute: 0.1 K/uL (ref 0.0–0.5)
Eosinophils Relative: 1 %
HCT: 42 % (ref 39.0–52.0)
Hemoglobin: 13.1 g/dL (ref 13.0–17.0)
Immature Granulocytes: 3 %
Lymphocytes Relative: 13 %
Lymphs Abs: 1.7 K/uL (ref 0.7–4.0)
MCH: 27.1 pg (ref 26.0–34.0)
MCHC: 31.2 g/dL (ref 30.0–36.0)
MCV: 87 fL (ref 80.0–100.0)
Monocytes Absolute: 0.8 K/uL (ref 0.1–1.0)
Monocytes Relative: 6 %
Neutro Abs: 10.1 K/uL — ABNORMAL HIGH (ref 1.7–7.7)
Neutrophils Relative %: 76 %
Platelets: 563 K/uL — ABNORMAL HIGH (ref 150–400)
RBC: 4.83 MIL/uL (ref 4.22–5.81)
RDW: 14.1 % (ref 11.5–15.5)
WBC: 13.1 K/uL — ABNORMAL HIGH (ref 4.0–10.5)
nRBC: 0 % (ref 0.0–0.2)

## 2023-12-28 LAB — TROPONIN I (HIGH SENSITIVITY)
Troponin I (High Sensitivity): 21 ng/L — ABNORMAL HIGH (ref <18)
Troponin I (High Sensitivity): 19 ng/L — ABNORMAL HIGH (ref <18)

## 2023-12-28 MED ORDER — PREDNISONE 5 MG (21) PO TBPK: ORAL_TABLET | ORAL | 0 refills | Status: DC

## 2023-12-28 MED ORDER — AMLODIPINE BESYLATE 5 MG PO TABS
10.0000 mg | ORAL_TABLET | Freq: Once | ORAL | Status: AC
  Administered 2023-12-28: 10 mg via ORAL
  Filled 2023-12-28: qty 2

## 2023-12-28 MED ORDER — LOSARTAN POTASSIUM 25 MG PO TABS
100.0000 mg | ORAL_TABLET | Freq: Once | ORAL | Status: AC
  Administered 2023-12-28: 100 mg via ORAL
  Filled 2023-12-28: qty 4

## 2023-12-28 MED ORDER — AMLODIPINE BESYLATE 10 MG PO TABS: 10.0000 mg | ORAL_TABLET | Freq: Every day | ORAL | 1 refills | Status: AC

## 2023-12-28 MED ORDER — OXYCODONE-ACETAMINOPHEN 5-325 MG PO TABS: ORAL_TABLET | ORAL | 0 refills | Status: DC

## 2023-12-28 MED ORDER — ONDANSETRON HCL 4 MG/2ML IJ SOLN
4.0000 mg | Freq: Once | INTRAMUSCULAR | Status: AC
  Administered 2023-12-28: 4 mg via INTRAVENOUS
  Filled 2023-12-28: qty 2

## 2023-12-28 MED ORDER — MORPHINE SULFATE (PF) 4 MG/ML IV SOLN
4.0000 mg | Freq: Once | INTRAVENOUS | Status: AC
  Administered 2023-12-28: 4 mg via INTRAVENOUS
  Filled 2023-12-28: qty 1

## 2023-12-28 MED ORDER — LOSARTAN POTASSIUM 100 MG PO TABS: 100.0000 mg | ORAL_TABLET | Freq: Every day | ORAL | 1 refills | Status: AC

## 2023-12-28 NOTE — ED Provider Notes (Signed)
 [facility] EMERGENCY DEPARTMENT AT [facility] Provider Note   CSN: [id number] Arrival date & time: 12/28/23  9045     Patient presents with: Hypertension   David Nicholson is a 63 y.o. male.   Patient is a 63 year old male who presents emergency department from his orthopedic provider's office secondary to hypertension.  Patient notes that he has been without his blood pressure medication for approximate past 4 months secondary to insurance problems.  Patient notes that he did have some chest pain last night but denies any active pain at this point.  He denies any abnormal headaches.  He denies any dizziness, lightheadedness or syncope.  He denies any numbness, paresthesias or unilateral weakness.  He notes he was seen in orthopedics secondary to a gout flare.  He denies any associated shortness of breath, abdominal pain, nausea, vomiting, diarrhea.  He notes he otherwise feels at his baseline at this time.   Hypertension Associated symptoms include chest pain.       Prior to Admission medications   Medication Sig Start Date End Date Taking? Authorizing Provider  acetaminophen  (TYLENOL ) 325 MG tablet Take 325 mg by mouth every 6 (six) hours as needed.    [provider]  allopurinol  (ZYLOPRIM ) 300 MG tablet Take 1 tablet (300 mg total) by mouth daily. 12/21/23   Brenna Lin, MD  amLODipine  (NORVASC ) 10 MG tablet Take 1 tablet (10 mg total) by mouth daily. 04/23/22   Zarwolo, Gloria, FNP  atorvastatin  (LIPITOR) 40 MG tablet Take 1 tablet (40 mg total) by mouth daily. 04/28/22   Zarwolo, Gloria, FNP  benzonatate  (TESSALON ) 100 MG capsule Take 1 capsule (100 mg total) by mouth 2 (two) times daily as needed for cough. 07/20/21   Paseda, Folashade R, FNP  cetirizine  (ZYRTEC ) 10 MG tablet Take 1 tablet (10 mg total) by mouth daily. 07/20/21   Paseda, Folashade R, FNP  fluticasone  (FLONASE ) 50 MCG/ACT nasal spray Place 2 sprays into both nostrils daily. 07/20/21   Paseda,  Folashade R, FNP  ibuprofen  (ADVIL ) 600 MG tablet Take 600 mg by mouth every 6 (six) hours as needed.    [provider]  losartan  (COZAAR ) 100 MG tablet Take 1 tablet (100 mg total) by mouth daily. 04/23/22   Zarwolo, Gloria, FNP  metFORMIN  (GLUCOPHAGE ) 500 MG tablet Take 1 tablet (500 mg total) by mouth 2 (two) times daily with a meal. 04/28/22   Zarwolo, Gloria, FNP  oxyCODONE -acetaminophen  (PERCOCET/ROXICET) 5-325 MG tablet One tablet every four hours as need for pain,  5 day limit. 12/28/23   Brenna Lin, MD  predniSONE  (STERAPRED UNI-PAK 21 TAB) 5 MG (21) TBPK tablet Take 6 pills first day; 5 pills second day; 4 pills third day; 3 pills fourth day; 2 pills next day and 1 pill last day. 12/28/23   Brenna Lin, MD  sildenafil  (VIAGRA ) 100 MG tablet Take 0.5 tablets (50 mg total) by mouth daily as needed for erectile dysfunction. 10/15/20   Elnor Lauraine BRAVO, NP  Vitamin D , Ergocalciferol , (DRISDOL ) 1.25 MG (50000 UNIT) CAPS capsule Take 1 capsule (50,000 Units total) by mouth every 7 (seven) days. 04/28/22   Zarwolo, Gloria, FNP    Allergies: Patient has no allergy information on record.    Review of Systems  Cardiovascular:  Positive for chest pain.  All other systems reviewed and are negative.   Updated Vital Signs BP (!) 188/107   Pulse 82   Temp 98.6 F (37 C) (Oral)   Resp (!) 27  Ht 5' 11 (1.803 m)   Wt 79.1 kg   SpO2 96%   BMI 24.32 kg/m   Physical Exam Vitals and nursing note reviewed.  Constitutional:      Appearance: Normal appearance.  HENT:     Head: Normocephalic and atraumatic.     Nose: Nose normal.     Mouth/Throat:     Mouth: Mucous membranes are moist.  Eyes:     Extraocular Movements: Extraocular movements intact.     Conjunctiva/sclera: Conjunctivae normal.     Pupils: Pupils are equal, round, and reactive to light.  Cardiovascular:     Rate and Rhythm: Normal rate and regular rhythm.     Pulses: Normal pulses.     Heart sounds: Normal  heart sounds. No murmur heard.    No gallop.  Pulmonary:     Effort: Pulmonary effort is normal. No respiratory distress.     Breath sounds: Normal breath sounds. No stridor. No wheezing, rhonchi or rales.  Abdominal:     General: Abdomen is flat. Bowel sounds are normal. There is no distension.     Palpations: Abdomen is soft.     Tenderness: There is no abdominal tenderness. There is no guarding.  Musculoskeletal:        General: Normal range of motion.     Cervical back: Normal range of motion and neck supple.     Right lower leg: No edema.     Left lower leg: No edema.  Skin:    General: Skin is warm and dry.  Neurological:     General: No focal deficit present.     Mental Status: He is alert and oriented to person, place, and time. Mental status is at baseline.  Psychiatric:        Mood and Affect: Mood normal.        Behavior: Behavior normal.        Thought Content: Thought content normal.        Judgment: Judgment normal.     (all labs ordered are listed, but only abnormal results are displayed) Labs Reviewed  CBC WITH DIFFERENTIAL/PLATELET  COMPREHENSIVE METABOLIC PANEL WITH GFR  TROPONIN I (HIGH SENSITIVITY)    EKG: None  Radiology: DG ELBOW COMPLETE LEFT (3+VIEW) Result Date: 12/28/2023 Clinical:  left elbow pain, history of gout, no trauma X-rays were done of the left elbow, three views. There is slight effusion of the left elbow, no fracture or loose body noted.  Bone quality is good. Impression:  slight effusion of left elbow, no fracture noted. Electronically Signed Lemond Stable, MD 7/23/20258:52 AM    Procedures   Medications Ordered in the ED  amLODipine  (NORVASC ) tablet 10 mg (has no administration in time range)  losartan  (COZAAR ) tablet 100 mg (has no administration in time range)                                    Medical Decision Making Amount and/or Complexity of Data Reviewed Labs: ordered. Radiology: ordered.  Risk Prescription drug  management.   This patient presents to the ED for concern of hypertension differential diagnosis includes ACS, resistant hypertension, primary hypertension, secondary hypertension, acute kidney injury, electrolyte derangement    Additional history obtained:  Additional history obtained from family External records from outside source obtained and reviewed including medical records   Lab Tests:  I Ordered, and personally interpreted labs.  The pertinent results include:  Mild leukocytosis, no anemia, creatinine at baseline, normal liver function, normal electrolytes, mild elevation of serial troponins   Imaging Studies ordered:  I ordered imaging studies including chest x-ray I independently visualized and interpreted imaging which showed no acute cardiopulmonary process I agree with the radiologist interpretation   Medicines ordered and prescription drug management:  I ordered medication including losartan , amlodipine , morphine , Zofran  for hypertension Reevaluation of the patient after these medicines showed that the patient improved I have reviewed the patients home medicines and have made adjustments as needed   Problem List / ED Course:  Patient is doing well at this time and is stable for discharge home.  He does remain completely asymptomatic while in the emergency department with no abnormal headaches, chest pain, shortness of breath, palpitations, numbness, paresthesias.  Discussed with patient that we will plan for discharge home at this point.  His current blood pressure on my reevaluation was 170/95.  Did discuss his case with Dr. Okey with cardiology given his mild elevation of troponin.  She does note that this appears to be secondary to his uncontrolled blood pressure at home.  Will restart him back on his losartan  and amlodipine .  Blood work is otherwise been unremarkable at this time.  Do not suspect that admission is warranted.  Strict turn precautions were discussed  for any new or worsening symptoms.  Patient and family voiced understanding and had no additional questions.  Stressed the importance of continued close follow-up with her primary care doctor.   Social Determinants of Health:  None        Final diagnoses:  None    ED Discharge Orders     None          Daralene Lonni JONETTA DEVONNA 12/28/23 1459    Suzette Pac, MD 01/01/24 1237

## 2023-12-28 NOTE — Patient Instructions (Addendum)
 GO to [facility] on [address] to get your lab work.  Patient was advised to go to ED states he has someone that can take him due to Blood pressure Call [phone] to schedule an appt with pcp after discharge from hospital

## 2023-12-28 NOTE — ED Triage Notes (Signed)
 Pt arrived via POV following Ortho Appointment earlier today for evaluation of hypertension. Pt reports due to insurance and PCP difficulties, he has been without BP Meds for a few months. Per note in Pts chart, Pts BP was 206/107 earlier today. Pt denies active CP, denies SOB.

## 2023-12-28 NOTE — Progress Notes (Signed)
 I am better but I still hurt a lot at times.  He has been in the posterior splint for the right elbow and used his sling.  He has less pain but he still has some swelling and still has pain.  He began his allopurinol  and took the prednisone  dose pack and pain medicine.  He has no new trauma.  His right foot is much improved.  Splint removed.  He has less effusion and swelling around the left elbow but still has tenderness and only slightly increased warmth.  ROM is painful.  NV intact.  Right foot is not hurting at all today.  Encounter Diagnoses  Name Primary?   Pain in left elbow Yes   Acute idiopathic gout of left elbow    I will refill his pain medicine and prednisone .  Continue the allopurinol .  I will get serum uric acid level.  His blood pressure is very high today.  We called his family doctor and was told to send him to the ER.  We will do this.  He is agreeable to this.  Return in one week.  Call if any problem.  Precautions discussed.  Electronically Signed Lemond Stable, MD 7/23/20258:59 AM

## 2023-12-28 NOTE — Discharge Instructions (Signed)
 Please follow-up closely with a primary care doctor on outpatient basis for continued management of your high blood pressure.  Please take the medications prescribed as directed.  Return to emergency department immediately for any new or worsening symptoms.

## 2023-12-28 NOTE — Telephone Encounter (Signed)
 FYI Only or Action Required?: FYI only for provider.  Patient was last seen in primary care on 06/22/2022 by Golda Lynwood PARAS, MD.  Called Nurse Triage reporting Hypertension.  Symptoms began today.  Symptoms are: unchanged.  Triage Disposition: Go to ED Now (Notify PCP)  Patient/caregiver understands and will follow disposition?: Yes                   Copied from CRM [id number]. Topic: Clinical - Red Word Triage >> Dec 28, 2023  8:31 AM Elle L wrote: Red Word that prompted transfer to Nurse Triage: Tammy with [facility] called to establish the patient at [facility] as the patient is at their office and his blood pressure is 206/107 and they are unable to treat him there for his blood pressure. Reason for Disposition  [1] Systolic BP >= 160 OR Diastolic >= 100 AND [2] cardiac (e.g., breathing difficulty, chest pain) or neurologic symptoms (e.g., new-onset blurred or double vision, unsteady gait)  Answer Assessment - Initial Assessment Questions This RN spoke with Tammy Long at [facility]. Pt is at office and has someone taking him to ED now.    1. BLOOD PRESSURE: What is your blood pressure? Did you take at least two measurements 5 minutes apart?     206/107 2. ONSET: When did you take your blood pressure?     5 minutes ago 3. HOW: How did you take your blood pressure? (e.g., automatic home BP monitor, visiting nurse)     In office 4. HISTORY: Do you have a history of high blood pressure?     Yes 5. MEDICINES: Are you taking any medicines for blood pressure? Have you missed any doses recently?     He hasn't seen anyone in a while  6. OTHER SYMPTOMS: Do you have any symptoms? (e.g., blurred vision, chest pain, difficulty breathing, headache, weakness)     Denies blurred vision, difficulty breathing, headache     Weakness- pt states he doesn't feel strong, new symptom     Had chest pain yesterday, felt like a pulled  muscle  Protocols used: Blood Pressure - High-A-AH

## 2024-01-04 ENCOUNTER — Ambulatory Visit: Payer: PRIVATE HEALTH INSURANCE | Admitting: Orthopaedic Surgery

## 2024-01-04 ENCOUNTER — Encounter: Payer: Self-pay | Admitting: Orthopaedic Surgery

## 2024-01-04 DIAGNOSIS — M25522 Pain in left elbow: Secondary | ICD-10-CM | POA: Diagnosis not present

## 2024-01-04 DIAGNOSIS — M10022 Idiopathic gout, left elbow: Secondary | ICD-10-CM | POA: Diagnosis not present

## 2024-01-04 LAB — URIC ACID: Uric Acid, Serum: 5 mg/dL (ref 4.0–8.0)

## 2024-01-04 MED ORDER — OXYCODONE-ACETAMINOPHEN 5-325 MG PO TABS: ORAL_TABLET | ORAL | 0 refills | Status: DC

## 2024-01-04 NOTE — Progress Notes (Signed)
 My elbow is still sore.  He has been in the posterior splint on the left arm and doing well with it.  His pain is less but still present.  Last week he went to the ER for his elevated blood pressure when he left this office.  I gave order for serum uric acid but it was not done as they did other studies and obviously did not see my order.  He is back on his blood pressure medicine.  He has no new trauma.  He is taking his allopurinol .  Left elbow has no effusion today, ROM is 15 to 95 with some pain, NV intact, no redness, no increased heat, grip is good.  Encounter Diagnoses  Name Primary?   Pain in left elbow Yes   Acute idiopathic gout of left elbow    I have reviewed the [place]  Controlled Substance Reporting System web site prior to prescribing narcotic medicine for this patient.  Call if any problem.  Precautions discussed.  Get the serum uric acid today.  Increase allopurinol  to two a day this week.  Electronically Signed Lemond Stable, MD 7/30/202510:54 AM

## 2024-01-11 ENCOUNTER — Telehealth: Payer: Self-pay | Admitting: Orthopaedic Surgery

## 2024-01-11 ENCOUNTER — Encounter: Payer: Self-pay | Admitting: Orthopaedic Surgery

## 2024-01-11 ENCOUNTER — Ambulatory Visit: Admitting: Orthopaedic Surgery

## 2024-01-11 DIAGNOSIS — M10022 Idiopathic gout, left elbow: Secondary | ICD-10-CM

## 2024-01-11 DIAGNOSIS — M25522 Pain in left elbow: Secondary | ICD-10-CM | POA: Diagnosis not present

## 2024-01-11 MED ORDER — OXYCODONE-ACETAMINOPHEN 5-325 MG PO TABS: ORAL_TABLET | ORAL | 0 refills | Status: DC

## 2024-01-11 NOTE — Patient Instructions (Signed)
 Referral has been placed for Physical therapy to [facility] rehab. They will call to schedule appt..   Follow up in 2 weeks with David Nicholson

## 2024-01-11 NOTE — Progress Notes (Signed)
 I am a little better.  He had normal range uric acid level.  He has less swelling of the elbow on the left.  But he still has pain trying to get full extension.  He has no new trauma.  He has no redness.  He is taking his allopurinol .  Left elbow has no swelling today.  He has no redness.  He has ROM of 15 to 100.  NV intact.  Encounter Diagnoses  Name Primary?   Pain in left elbow Yes   Acute idiopathic gout of left elbow    I have reviewed the [place]  Controlled Substance Reporting System web site prior to prescribing narcotic medicine for this patient.  I will begin PT.  Continue his allopurinol .  Return in two weeks.  Call if any problem.  Precautions discussed.  Electronically Signed Lemond Stable, MD 8/6/202510:10 AM

## 2024-01-11 NOTE — Telephone Encounter (Signed)
 Lvm to return call

## 2024-01-11 NOTE — Telephone Encounter (Signed)
 DR. BRENNA David Nicholson is checking out he thought Dr. BRENNA was also going to call in something for his gout.  I told him he called in oxycodone .  He said yes and he thought he was going to call in something else too  please call him and advise.

## 2024-01-12 NOTE — Telephone Encounter (Signed)
 Pt just needs to continue his alluprinol

## 2024-01-19 ENCOUNTER — Telehealth: Payer: Self-pay | Admitting: Orthopaedic Surgery

## 2024-01-19 MED ORDER — OXYCODONE-ACETAMINOPHEN 5-325 MG PO TABS: ORAL_TABLET | ORAL | 0 refills | Status: DC

## 2024-01-19 NOTE — Telephone Encounter (Signed)
 Dr. Vicente Graham pt - pt lvm requesting a refill for Oxycodone  5-325 to be sent to [facility]

## 2024-01-25 ENCOUNTER — Ambulatory Visit: Admitting: Orthopaedic Surgery

## 2024-01-26 ENCOUNTER — Telehealth: Payer: Self-pay | Admitting: Orthopaedic Surgery

## 2024-01-26 MED ORDER — OXYCODONE-ACETAMINOPHEN 5-325 MG PO TABS: ORAL_TABLET | ORAL | 0 refills | Status: DC

## 2024-01-26 NOTE — Telephone Encounter (Signed)
 Dr. Vicente Graham pt - pt lvm requesting a refill for Oxycodone  5-325 to be sent to [facility]

## 2024-01-31 ENCOUNTER — Telehealth: Payer: Self-pay | Admitting: *Deleted

## 2024-01-31 MED ORDER — ALLOPURINOL 300 MG PO TABS: 300.0000 mg | ORAL_TABLET | Freq: Two times a day (BID) | ORAL | 5 refills | Status: DC

## 2024-01-31 MED ORDER — OXYCODONE-ACETAMINOPHEN 5-325 MG PO TABS: ORAL_TABLET | ORAL | 0 refills | Status: DC

## 2024-01-31 NOTE — Telephone Encounter (Signed)
 Pt came in having pain in his left hand and wanted Dr. Brenna to fill his gout meds allopurinol  (ZYLOPRIM ) 300 MG tablet and pain meds  oxyCODONE -acetaminophen  (PERCOCET/ROXICET) 5-325 MG tablet at [facility] please.He has one more pill for the GOUT and he said he ran out because he was instructed to take 2 a day instead of 1 so the script need to say 2 times a day.

## 2024-02-03 ENCOUNTER — Ambulatory Visit: Admitting: Orthopedic Surgery

## 2024-02-08 ENCOUNTER — Encounter: Payer: Self-pay | Admitting: Orthopaedic Surgery

## 2024-02-08 ENCOUNTER — Ambulatory Visit (INDEPENDENT_AMBULATORY_CARE_PROVIDER_SITE_OTHER): Admitting: Orthopaedic Surgery

## 2024-02-08 VITALS — BP 156/88 | HR 76 | Ht 71.0 in | Wt 174.0 lb

## 2024-02-08 DIAGNOSIS — M10041 Idiopathic gout, right hand: Secondary | ICD-10-CM

## 2024-02-08 MED ORDER — OXYCODONE-ACETAMINOPHEN 5-325 MG PO TABS: ORAL_TABLET | ORAL | 0 refills | Status: DC

## 2024-02-08 MED ORDER — PREDNISONE 5 MG (21) PO TBPK: ORAL_TABLET | ORAL | 0 refills | Status: AC

## 2024-02-08 NOTE — Progress Notes (Signed)
 My elbow is ok but my hand is swollen.  He ran out of his allopurinol .  I called in refill last week but he did not go and get it.  Now he has swelling and pain of the right hand secondary to gout.  He has no trauma.  His elbow has full ROM and no pain now.  Right hand is swollen, tender, warm all consistent with gout attack.  Encounter Diagnosis  Name Primary?   Acute idiopathic gout of right hand Yes   Get his medicine.  I will call in prednisone  and refill his pain medicine.  I have reviewed the   Controlled Substance Reporting System web site prior to prescribing narcotic medicine for this patient.  Return in one week.  Call if any problem.  Precautions discussed.  Electronically Signed Lemond Stable, MD 9/3/20258:59 AM

## 2024-02-08 NOTE — Patient Instructions (Signed)
 Return in one week with Dr. Brenna.

## 2024-02-15 ENCOUNTER — Ambulatory Visit (INDEPENDENT_AMBULATORY_CARE_PROVIDER_SITE_OTHER): Payer: PRIVATE HEALTH INSURANCE | Admitting: Orthopaedic Surgery

## 2024-02-15 ENCOUNTER — Ambulatory Visit: Admitting: Orthopedic Surgery

## 2024-02-15 ENCOUNTER — Encounter: Payer: Self-pay | Admitting: Orthopaedic Surgery

## 2024-02-15 DIAGNOSIS — M10041 Idiopathic gout, right hand: Secondary | ICD-10-CM

## 2024-02-15 MED ORDER — NAPROXEN 500 MG PO TABS: 500.0000 mg | ORAL_TABLET | Freq: Two times a day (BID) | ORAL | 5 refills | Status: AC

## 2024-02-15 MED ORDER — OXYCODONE-ACETAMINOPHEN 5-325 MG PO TABS: ORAL_TABLET | ORAL | 0 refills | Status: DC

## 2024-02-15 NOTE — Progress Notes (Signed)
 My hand is still tender.  He has pain in the right second metacarpal phalangeal joint.  He has some swelling.  He is taking his gout medicine.  He has no new trauma.  NV intact.  He has no redness of the index finger on the right. The prednisone  helped.   Encounter Diagnosis  Name Primary?   Acute idiopathic gout of right hand Yes   Continue the allopurinol .  I will add Naprosyn  500 po bid pc.  I have informed the patient I will be retiring from medical practice and from this office on March 08, 2024.  The patient has been offered continuing care with Dr. Margrette or Dr. Onesimo of this office.  The patient may choose another provider and the records will be forwarded after proper signature and notification.  Patient understands and agrees.  Return in one month.  I have reviewed the [place]  Controlled Substance Reporting System web site prior to prescribing narcotic medicine for this patient.  Call if any problem.  Precautions discussed.  Electronically Signed Lemond Stable, MD 9/10/20259:58 AM

## 2024-02-23 ENCOUNTER — Ambulatory Visit: Admitting: Family Medicine

## 2024-02-28 ENCOUNTER — Encounter: Payer: Self-pay | Admitting: Family Medicine

## 2024-03-14 ENCOUNTER — Other Ambulatory Visit (INDEPENDENT_AMBULATORY_CARE_PROVIDER_SITE_OTHER): Payer: Self-pay

## 2024-03-14 ENCOUNTER — Encounter: Payer: Self-pay | Admitting: Orthopedic Surgery

## 2024-03-14 ENCOUNTER — Ambulatory Visit: Payer: PRIVATE HEALTH INSURANCE | Admitting: Orthopedic Surgery

## 2024-03-14 DIAGNOSIS — M79641 Pain in right hand: Secondary | ICD-10-CM | POA: Diagnosis not present

## 2024-03-14 DIAGNOSIS — M10041 Idiopathic gout, right hand: Secondary | ICD-10-CM

## 2024-03-14 DIAGNOSIS — G894 Chronic pain syndrome: Secondary | ICD-10-CM

## 2024-03-14 MED ORDER — ALLOPURINOL 300 MG PO TABS: 300.0000 mg | ORAL_TABLET | Freq: Two times a day (BID) | ORAL | 5 refills | Status: AC

## 2024-03-14 MED ORDER — COLCHICINE 0.6 MG PO TABS: 0.6000 mg | ORAL_TABLET | Freq: Two times a day (BID) | ORAL | 5 refills | Status: AC

## 2024-03-14 MED ORDER — OXYCODONE-ACETAMINOPHEN 5-325 MG PO TABS
ORAL_TABLET | ORAL | 0 refills | Status: AC
Start: 2024-03-14 — End: ?

## 2024-03-14 NOTE — Progress Notes (Signed)
    03/14/2024   Chief Complaint  Patient presents with   Hand Pain    Right- has gout doing a lot better   Medication Refill    Allopurinol  and [place] pain medicine if you will did advise that you do not manage chronic pain    Encounter Diagnosis  Name Primary?   Acute idiopathic gout of right hand Yes    What pharmacy do you use ? _[facility]_________  Improved

## 2024-03-14 NOTE — Progress Notes (Signed)
   Chief Complaint  Patient presents with   Hand Pain    Right- has gout doing a lot better   Medication Refill    Allopurinol  and Colchicine pain medicine if you will did advise that you do not manage chronic pain    63 year old male who carries a diagnosis of gout had what was believed to be a gout attack which was relieved with allopurinol  and colchicine.  Patient complains of continued pain over the first webspace including the metacarpal phalangeal joint of the index finger  He has improved with the current medication which included a dose of prednisone  but still has some stiffness and pain in that area  He also has pain in multiple joints for which she has been taking oxycodone   Physical Exam Musculoskeletal:     Right hand: Swelling, tenderness and bony tenderness present. No deformity. Decreased range of motion. Normal strength. Normal sensation. Normal capillary refill. Normal pulse.     Comments: Swelling over the second metacarpal phalangeal joint since  Slight decrease in ability to make a full fist secondary to the swelling of the MCP    DG Hand Complete Right Result Date: 03/14/2024 X-ray report Chief complaint pain right hand metacarpal phalangeal joint index finger Images 3 views right hand Reading: Normal bony anatomy around the area there appears to be some soft tissue swelling in the first webspace, no periarticular erosions no joint space narrowing no osteophytes are seen around the joint Impression: Normal bony articular anatomy with soft tissue swelling first webspace     Plan is to continue the colchicine and allopurinol   The patient has a refill for his oxycodone .  However, if he has to continue this medication he is advised that he will need a referral to pain management  Meds ordered this encounter  Medications   allopurinol  (ZYLOPRIM ) 300 MG tablet    Sig: Take 1 tablet (300 mg total) by mouth 2 (two) times daily.    Dispense:  60 tablet    Refill:  5    colchicine 0.6 MG tablet    Sig: Take 1 tablet (0.6 mg total) by mouth 2 (two) times daily.    Dispense:  60 tablet    Refill:  5   oxyCODONE -acetaminophen  (PERCOCET/ROXICET) 5-325 MG tablet    Sig: One tablet every four hours as need for pain,  5 day limit.    Dispense:  18 tablet    Refill:  0

## 2024-03-14 NOTE — Patient Instructions (Signed)
 [facility] [address]  [phone]  We will send referral there, you call next week to schedule, they will call you too.

## 2024-05-23 ENCOUNTER — Encounter (INDEPENDENT_AMBULATORY_CARE_PROVIDER_SITE_OTHER): Payer: Self-pay | Admitting: *Deleted

## 2024-06-26 ENCOUNTER — Other Ambulatory Visit: Payer: Self-pay

## 2024-06-26 ENCOUNTER — Emergency Department (HOSPITAL_COMMUNITY)
Admission: EM | Admit: 2024-06-26 | Discharge: 2024-06-26 | Disposition: A | Discharge status: Home / Self Care | Attending: Emergency Medicine | Admitting: Emergency Medicine

## 2024-06-26 ENCOUNTER — Encounter (HOSPITAL_COMMUNITY): Payer: Self-pay

## 2024-06-26 ENCOUNTER — Emergency Department (HOSPITAL_COMMUNITY)

## 2024-06-26 DIAGNOSIS — W503XXA Accidental bite by another person, initial encounter: Secondary | ICD-10-CM

## 2024-06-26 DIAGNOSIS — S0990XA Unspecified injury of head, initial encounter: Secondary | ICD-10-CM | POA: Diagnosis not present

## 2024-06-26 DIAGNOSIS — Z23 Encounter for immunization: Secondary | ICD-10-CM | POA: Diagnosis not present

## 2024-06-26 DIAGNOSIS — S5011XA Contusion of right forearm, initial encounter: Secondary | ICD-10-CM | POA: Diagnosis present

## 2024-06-26 MED ORDER — TETANUS-DIPHTH-ACELL PERTUSSIS 5-2-15.5 LF-MCG/0.5 IM SUSP
0.5000 mL | Freq: Once | INTRAMUSCULAR | Status: AC
  Administered 2024-06-26: 0.5 mL via INTRAMUSCULAR
  Filled 2024-06-26: qty 0.5

## 2024-06-26 MED ORDER — AMOXICILLIN-POT CLAVULANATE 875-125 MG PO TABS: 1.0000 | ORAL_TABLET | Freq: Two times a day (BID) | ORAL | 0 refills | Status: AC
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
## 2024-06-26 NOTE — Discharge Instructions (Signed)
 Keep the wound clean with mild soap and water .  Take the antibiotic as directed until finished.  Please avoid straining heavy lifting pushing or pulling for at least 1 week.  Please follow-up with your primary care provider for recheck regarding your head injury.  Return to the emergency department for any new or worsening symptoms.
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
